# Patient Record
Sex: Female | Born: 1945 | Race: White | Hispanic: No | Marital: Married | Smoking: Never smoker
Health system: Southern US, Community
[De-identification: ages and names within clinical notes are randomized; demographics above are authoritative.]

## PROBLEM LIST (undated history)

## (undated) DIAGNOSIS — I10 Essential (primary) hypertension: Secondary | ICD-10-CM

## (undated) DIAGNOSIS — C801 Malignant (primary) neoplasm, unspecified: Secondary | ICD-10-CM

## (undated) DIAGNOSIS — M1990 Unspecified osteoarthritis, unspecified site: Secondary | ICD-10-CM

## (undated) DIAGNOSIS — J189 Pneumonia, unspecified organism: Secondary | ICD-10-CM

## (undated) DIAGNOSIS — E119 Type 2 diabetes mellitus without complications: Secondary | ICD-10-CM

## (undated) DIAGNOSIS — F419 Anxiety disorder, unspecified: Secondary | ICD-10-CM

## (undated) DIAGNOSIS — E039 Hypothyroidism, unspecified: Secondary | ICD-10-CM

## (undated) DIAGNOSIS — J45909 Unspecified asthma, uncomplicated: Secondary | ICD-10-CM

## (undated) HISTORY — DX: Essential (primary) hypertension: I10

## (undated) HISTORY — PX: OTHER SURGICAL HISTORY: SHX169

## (undated) HISTORY — PX: KNEE ARTHROSCOPY: SUR90

## (undated) HISTORY — PX: BREAST BIOPSY: SHX20

## (undated) HISTORY — PX: TONSILLECTOMY: SUR1361

## (undated) HISTORY — PX: SHOULDER SURGERY: SHX246

---

## 1983-02-26 HISTORY — PX: KNEE ARTHROSCOPY: SUR90

## 1996-02-26 HISTORY — PX: SHOULDER SURGERY: SHX246

## 2007-02-24 ENCOUNTER — Ambulatory Visit (HOSPITAL_COMMUNITY): Admission: RE | Admit: 2007-02-24 | Discharge: 2007-02-24 | Payer: Self-pay | Admitting: Orthopedic Surgery

## 2011-07-11 ENCOUNTER — Encounter (HOSPITAL_COMMUNITY): Payer: Self-pay | Admitting: Pharmacist

## 2011-07-15 ENCOUNTER — Encounter (HOSPITAL_COMMUNITY)
Admission: RE | Disposition: A | Discharge status: Home / Self Care | Payer: Self-pay | Source: Ambulatory Visit | Attending: Ophthalmology

## 2011-07-15 ENCOUNTER — Ambulatory Visit (HOSPITAL_COMMUNITY)
Admission: RE | Admit: 2011-07-15 | Discharge: 2011-07-15 | Disposition: A | Discharge status: Home / Self Care | Payer: Medicare Other | Source: Ambulatory Visit | Attending: Ophthalmology | Admitting: Ophthalmology

## 2011-07-15 DIAGNOSIS — H26499 Other secondary cataract, unspecified eye: Secondary | ICD-10-CM | POA: Insufficient documentation

## 2011-07-15 SURGERY — TREATMENT, USING YAG LASER
Anesthesia: LOCAL | Site: Eye | Laterality: Right

## 2011-07-15 MED ORDER — TETRACAINE HCL 0.5 % OP SOLN
OPHTHALMIC | Status: AC
  Administered 2011-07-15: 1 [drp] via OPHTHALMIC
  Filled 2011-07-15: qty 2

## 2011-07-15 MED ORDER — APRACLONIDINE HCL 1 % OP SOLN
1.0000 [drp] | Freq: Once | OPHTHALMIC | Status: AC
  Administered 2011-07-15: 1 [drp] via OPHTHALMIC

## 2011-07-15 MED ORDER — TROPICAMIDE 1 % OP SOLN
OPHTHALMIC | Status: AC
  Administered 2011-07-15: 1 [drp] via OPHTHALMIC
  Filled 2011-07-15: qty 3

## 2011-07-15 MED ORDER — TETRACAINE HCL 0.5 % OP SOLN
1.0000 [drp] | Freq: Two times a day (BID) | OPHTHALMIC | Status: AC
  Administered 2011-07-15 (×2): 1 [drp] via OPHTHALMIC

## 2011-07-15 MED ORDER — APRACLONIDINE HCL 1 % OP SOLN
OPHTHALMIC | Status: AC
  Administered 2011-07-15: 1 [drp] via OPHTHALMIC
  Filled 2011-07-15: qty 0.1

## 2011-07-15 MED ORDER — TROPICAMIDE 1 % OP SOLN
1.0000 [drp] | Freq: Three times a day (TID) | OPHTHALMIC | Status: DC
  Administered 2011-07-15 (×2): 1 [drp] via OPHTHALMIC

## 2011-07-15 MED ORDER — PILOCARPINE HCL 1 % OP SOLN
OPHTHALMIC | Status: AC
  Filled 2011-07-15: qty 15

## 2011-07-15 MED ORDER — APRACLONIDINE HCL 1 % OP SOLN
1.0000 [drp] | Freq: Three times a day (TID) | OPHTHALMIC | Status: AC
  Administered 2011-07-15 (×3): 1 [drp] via OPHTHALMIC

## 2011-07-15 NOTE — CV Procedure (Signed)
Whitney Gill 07/15/2011  Susa Simmonds, MD  Yag Laser Self Test Completedyes. Procedure: Posterior Capsulotomy, right eye.  Eye Protection Worn by Staff yes. Laser In Use Sign on Door yes.  Laser: Nd:YAG Spot Size: Fixed Burst Mode: III Power Setting: 2.0  mJ/burst Position treated: central capsule o'clock position Number of shots: 30 Total energy delivered: 58.9 mJ  Patency of the peripheral iridotomy was confirmed visually.  The patient tolerated the procedure without difficulty. No complications were encountered.  Tenometer reading immediately after procedure: 17/19 mmHg.  The patient was discharged home with the instructions to continue all her current glaucoma medications, if any.   Patient instructed to go to office prn for intraocular pressure check if eye pain occurs.  Patient verbalizes understanding of discharge instructions yes.   Notes:wrinkled, thickened posterior capsule noted.

## 2011-07-15 NOTE — H&P (Signed)
See scanned H&P from office 

## 2011-07-15 NOTE — Discharge Instructions (Signed)
Stevana Dufner  07/12/2011     Instructions    Activity: No Restrictions.   Diet: Resume Diet you were on at home.   Pain Medication: Tylenol if Needed.   CONTACT YOUR DOCTOR IF YOU HAVE PAIN, REDNESS IN YOUR EYE, OR DECREASED VISION.   Follow-up:{follow up:32580} with Susa Simmonds, MD.   Dr. Lahoma Crocker: [phone]  Dr. Lita Mains: [phone]  Dr. Alto Denver: [phone]   If you find that you cannot contact your physician, but feel that your signs and   Symptoms warrant a physician's attention, call the Emergency Room at   [phone] [phone].   Other{NA AND PPIRJJOA:41660}.

## 2014-08-11 ENCOUNTER — Encounter: Payer: Self-pay | Admitting: Internal Medicine

## 2014-08-11 ENCOUNTER — Encounter (INDEPENDENT_AMBULATORY_CARE_PROVIDER_SITE_OTHER): Payer: Self-pay

## 2014-08-11 ENCOUNTER — Ambulatory Visit (INDEPENDENT_AMBULATORY_CARE_PROVIDER_SITE_OTHER): Payer: Medicare Other | Admitting: Internal Medicine

## 2014-08-11 VITALS — BP 160/108 | HR 78 | Ht 67.0 in | Wt 219.0 lb

## 2014-08-11 DIAGNOSIS — K219 Gastro-esophageal reflux disease without esophagitis: Secondary | ICD-10-CM | POA: Diagnosis not present

## 2014-08-11 DIAGNOSIS — I10 Essential (primary) hypertension: Secondary | ICD-10-CM

## 2014-08-11 DIAGNOSIS — R05 Cough: Secondary | ICD-10-CM | POA: Diagnosis not present

## 2014-08-11 DIAGNOSIS — R058 Other specified cough: Secondary | ICD-10-CM | POA: Insufficient documentation

## 2014-08-11 MED ORDER — PANTOPRAZOLE SODIUM 40 MG PO TBEC: 40.0000 mg | DELAYED_RELEASE_TABLET | Freq: Every day | ORAL | Status: DC

## 2014-08-11 MED ORDER — TRAMADOL HCL 50 MG PO TABS: ORAL_TABLET | ORAL | Status: DC

## 2014-08-11 NOTE — Progress Notes (Signed)
Subjective:    Patient ID: Whitney Gill, female    DOB: Sep 29, 1945,     MRN: [id number]  HPI  67 yowf never smoked "bronchitis" in elementary school resolved/  ok through hs s meds including sports / then asthma in [employer] admitted for a week early 8s p ? 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 Then around 2010 needed zyrtec just in spring prn pnds and occ   had bronchitis but no need for maint rx then recurrent cough starting May 2016 and has failed qvar / rocephin/ zpak and no better so referred by Dr Dimas Aguas to pulmonary clinic 08/11/2014    08/11/2014 [facility] office visit/ Corwin Kuiken   Chief Complaint  Patient presents with  . Pulmonary Consult    Referred by Dr. Selinda Flavin. Pt c/o cough x 5 wks- occ prod with minimal clear to yellow sputum. She also c/o occ chest tightness. Cough gets worse when she talks.   retired from teaching 2004 and tendency to rhinitis contolled with zyrtec now with persistent daily cough x 5 weeks  Worse with talking  And generally less at hs but needing to sleep in recliner due to noct cough x sev weeks Stopped maint ppi w/in the last year due to reports re renal effects  No obvious other patterns in day to day or daytime variabilty or assoc sob unless coughing or  cp or subjective wheeze overt sinus or hb symptoms. No unusual exp hx or h/o childhood pna/ asthma (just "bronchitis" ) or knowledge of premature birth.   Also denies any obvious fluctuation of symptoms with weather or environmental changes or other aggravating or alleviating factors except as outlined above   Current Medications, Allergies, Complete Past Medical History, Past Surgical History, Family History, and Social History were reviewed in [employer] record.              Review of Systems  Constitutional: Negative for fever, chills and unexpected weight change.  HENT: Negative for congestion, dental problem, ear pain, nosebleeds, postnasal drip, rhinorrhea,  sinus pressure, sneezing, sore throat, trouble swallowing and voice change.   Eyes: Negative for visual disturbance.  Respiratory: Positive for cough. Negative for choking and shortness of breath.   Cardiovascular: Negative for chest pain and leg swelling.  Gastrointestinal: Negative for vomiting, abdominal pain and diarrhea.  Genitourinary: Negative for difficulty urinating.  Musculoskeletal: Negative for arthralgias.  Skin: Negative for rash.  Neurological: Negative for tremors, syncope and headaches.  Hematological: Does not bruise/bleed easily.       Objective:   Physical Exam  amb wf nad very hoarse   Wt Readings from Last 3 Encounters:  08/11/14 219 lb (99.338 kg)    Vital signs reviewed/ note HBP  HEENT: nl dentition, turbinates, and orophanx. Nl external ear canals without cough reflex   NECK :  without JVD/Nodes/TM/ nl carotid upstrokes bilaterally   LUNGS: no acc muscle use, clear to A and P bilaterally with cough on insp maneuvers only   CV:  RRR  no s3 or murmur or increase in P2, no edema   ABD:  soft and nontender with nl excursion in the supine position. No bruits or organomegaly, bowel sounds nl  MS:  warm without deformities, calf tenderness, cyanosis or clubbing  SKIN: warm and dry without lesions    NEURO:  alert, approp, no deficits    I personally reviewed images and agree with radiology impression as follows:  CXR:  08/03/14 wnl  Assessment & Plan:

## 2014-08-11 NOTE — Patient Instructions (Addendum)
Upper airway cough syndrome/ Whitney Gill   The key to effective treatment for your cough is eliminating the non-stop cycle of cough you're stuck in long enough to let your airway heal completely and then see if there is anything still making you cough once you stop the cough suppression, but this should take no more than 5 days to figure out  First take delsym two tsp every 12 hours and supplement if needed with  tramadol 50 mg up to 2 every 4 hours to suppress the urge to cough at all or even clear your throat. Swallowing water or using ice chips/non mint and menthol containing candies (such as lifesavers or sugarless jolly ranchers) are also effective.  You should rest your voice and avoid activities that you know make you cough.  Once you have eliminated the cough for 3 straight days try reducing the tramadol first,  then the delsym as tolerated.     Protonix (pantoprazole) Take 30-60 min before first meal of the day and Pepcid 20 mg one bedtime plus chlorpheniramine 4 mg x 2 at bedtime (both available over the counter)  until cough is completely gone for at least a week without the need for cough suppression  GERD (REFLUX)  is an extremely common cause of respiratory symptoms, many times with no significant heartburn at all.    It can be treated with medication, but also with lifestyle changes including avoidance of late meals, excessive alcohol, smoking cessation, and avoid fatty foods, chocolate, peppermint, colas, red wine, and acidic juices such as orange juice.  NO MINT OR MENTHOL PRODUCTS SO NO COUGH DROPS  USE HARD CANDY INSTEAD (jolley ranchers or Stover's or Lifesavers (all available in sugarless versions) NO OIL BASED VITAMINS - use powdered substitutes.  Stop qvar   If not better in two weeks >  Next step See Tammy NP  with all your medications, even over the counter meds, separated in two separate bags, the ones you take no matter what vs the ones you stop once you feel better  and take only as needed when you feel you need them.   Tammy  will generate for you a new user friendly medication calendar that will put [place] all on the same page re: your medication use.     Without this process, it simply isn't possible to assure that we are providing  your outpatient care  with  the attention to detail we feel you deserve.   If we cannot assure that you're getting that kind of care,  then we cannot manage your problem effectively from this clinic.  Once you have seen Tammy and we are sure that we're all on the same page with your medication use she will arrange follow up with me.

## 2014-08-12 ENCOUNTER — Encounter: Payer: Self-pay | Admitting: Internal Medicine

## 2014-08-12 DIAGNOSIS — I10 Essential (primary) hypertension: Secondary | ICD-10-CM | POA: Insufficient documentation

## 2014-08-12 DIAGNOSIS — K219 Gastro-esophageal reflux disease without esophagitis: Secondary | ICD-10-CM | POA: Insufficient documentation

## 2014-08-12 NOTE — Assessment & Plan Note (Signed)
Not Adequate control on present rx, reviewed > rec  No salt.  Would be careful with higher doses of BB here if concern with asthma in the ddx ( thoug I think this is less likely than uacs at present)   I prefer in this setting: Bystolic, the most beta -1  selective Beta blocker available in sample form, with bisoprolol the most selective generic choice  on the market but defer rx/ f/u to Dr Jeannette How capable hands.

## 2014-08-12 NOTE — Assessment & Plan Note (Addendum)
Body mass index is 34.29 kg/(m^2).  Note main risk factor is obesity and  she was on ppi as maint before stopping it and having the worst cough of her life. See uacs  Discussed the recent press about ppi's in the context of a statistically significant (but questionably clinically relevant) increase in CRI in pts on ppi vs h2's > bottom line is the lowest dose of ppi that controls   gerd is the right dose and if that dose is zero that's fine esp since h2's are cheaper but in the short run for this pattern cough she needs at least to take 40 mg  ppi ac daily.

## 2014-08-12 NOTE — Assessment & Plan Note (Addendum)
-   spirometry 08/05/14 wnl including mid flows   The most common causes of chronic cough in immunocompetent adults include the following: upper airway cough syndrome (UACS), previously referred to as postnasal drip syndrome (PNDS), which is caused by variety of rhinosinus conditions; (2) asthma; (3) GERD; (4) chronic bronchitis from cigarette smoking or other inhaled environmental irritants; (5) nonasthmatic eosinophilic bronchitis; and (6) bronchiectasis.   These conditions, singly or in combination, have accounted for up to 94% of the causes of chronic cough in prospective studies.   Other conditions have constituted no >6% of the causes in prospective studies These have included bronchogenic carcinoma, chronic interstitial pneumonia, sarcoidosis, left ventricular failure, ACEI-induced cough, and aspiration from a condition associated with pharyngeal dysfunction.    Chronic cough is often simultaneously caused by more than one condition. A single cause has been found from 38 to 82% of the time, multiple causes from 18 to 62%. Multiply caused cough has been the result of three diseases up to 42% of the time.       Based on hx and exam, this is most likely:  Classic Upper airway cough syndrome, so named because it's frequently impossible to sort out how much is  CR/sinusitis with freq throat clearing (which can be related to primary GERD)   vs  causing  secondary (" extra esophageal")  GERD from wide swings in gastric pressure that occur with throat clearing, often  promoting self use of mint and menthol lozenges that reduce the lower esophageal sphincter tone and exacerbate the problem further in a cyclical fashion.   These are the same pts (now being labeled as having "irritable larynx syndrome" by some cough centers) who not infrequently have a history of having failed to tolerate ace inhibitors,  dry powder inhalers(or ICS in any form, even qvar)  or biphosphonates or report having atypical reflux  symptoms that don't respond to standard doses of PPI , and are easily confused as having aecopd or asthma flares by even experienced allergists/ pulmonologists.   The first step is to maximize acid suppression and eliminate cyclical coughing then regroup if the cough persists off qvar  Explained the natural history of uri and why it's necessary in patients at risk to treat GERD aggressively - at least  short term -   to reduce risk of evolving cyclical cough initially  triggered by epithelial injury and a heightened sensitivty to the effects of any upper airway irritants,  most importantly acid - related - then perpetuated by epithelial injury related to the cough itself as the upper airway collapses on itself.  That is, the more sensitive the epithelium becomes once it is damaged by the virus, the more the ensuing irritability> the more the cough, the more the secondary reflux (especially in those prone to reflux) the more the irritation of the sensitive mucosa and so on in a  Classic cyclical pattern.     I had an extended discussion with the patient reviewing all relevant studies completed to date and  lasting 35 m Each maintenance medication was reviewed in detail including most importantly the difference between maintenance and as needed and under what circumstances the prns are to be used.  Please see instructions for details which were reviewed in writing and the patient given a copy.

## 2014-09-06 ENCOUNTER — Encounter: Payer: Medicare Other | Admitting: Adult Health

## 2016-04-25 ENCOUNTER — Encounter (INDEPENDENT_AMBULATORY_CARE_PROVIDER_SITE_OTHER): Payer: Self-pay | Admitting: *Deleted

## 2018-05-13 ENCOUNTER — Encounter (INDEPENDENT_AMBULATORY_CARE_PROVIDER_SITE_OTHER): Payer: Self-pay | Admitting: *Deleted

## 2018-06-09 ENCOUNTER — Telehealth: Payer: Self-pay | Admitting: Internal Medicine

## 2018-06-09 NOTE — Telephone Encounter (Signed)
Pt has not been seen at office since 08/11/2014 but meds last prescribed by MW at that visit are listed below: First take delsym two tsp every 12 hours and supplement if needed with  tramadol 50 mg up to 2 every 4 hours to suppress the urge to cough at all or even clear your throat. Swallowing water or using ice chips/non mint and menthol containing candies (such as lifesavers or sugarless jolly ranchers) are also effective.  You should rest your voice and avoid activities that you know make you cough.  Once you have eliminated the cough for 3 straight days try reducing the tramadol first,  then the delsym as tolerated.    Protonix (pantoprazole) Take 30-60 min before first meal of the day and Pepcid 20 mg one bedtime plus chlorpheniramine 4 mg x 2 at bedtime (both available over the counter)  until cough is completely gone for at least a week without the need for cough suppression.  Attempted to call pt at number listed x3 but each time I called, immediately received a recording stating that the service has not been set up at the number that we were calling. Unable to leave a message. Will try to call back later.

## 2018-06-10 NOTE — Telephone Encounter (Signed)
Returned call to patient. Relayed message per AVS 07/2014. Explained to patient how to reset her mychart. She is to then call office back. We will send her AVS via my chart. Pt has cough again and wanted to know what Dr.Wert prescribed. Nothing further needed.

## 2018-06-29 ENCOUNTER — Ambulatory Visit (INDEPENDENT_AMBULATORY_CARE_PROVIDER_SITE_OTHER): Payer: Medicare Other | Admitting: Internal Medicine

## 2018-06-29 ENCOUNTER — Encounter (INDEPENDENT_AMBULATORY_CARE_PROVIDER_SITE_OTHER): Payer: Self-pay | Admitting: Internal Medicine

## 2018-06-29 ENCOUNTER — Other Ambulatory Visit: Payer: Self-pay

## 2018-06-29 VITALS — BP 131/78 | HR 109 | Temp 98.1°F | Ht 67.0 in | Wt 201.2 lb

## 2018-06-29 DIAGNOSIS — K580 Irritable bowel syndrome with diarrhea: Secondary | ICD-10-CM

## 2018-06-29 NOTE — Progress Notes (Signed)
Subjective:     Patient ID: Whitney Gill, female   DOB: 10/02/1945, 73 y.o.   MRN: [id number]  HPI Referred by Dr. Dimas Aguas for diarrhea/colonoscopy.She tells me she is doing okay. Sometimes she has urgency after meals at times. Last episode was about 2 weeks ago while she was at home. She says she cannot put a pattern to it. For the most part, she has 2-3 soft stools a a day. She does tell me that we she has urgency, there is no warning. Symptoms for several years. No rectal bleeding. Her appetite is good. No weight loss.    Colonoscopy 2008: Mild diverticulitis.(per records)  No family hx of colon cancer. Review of Systems Past Medical History:  Diagnosis Date  . Hypertension     History reviewed. No pertinent surgical history.  Allergies  Allergen Reactions  . Prednisone Palpitations    Heart racing & turns red    Current Outpatient Medications on File Prior to Visit  Medication Sig Dispense Refill  . albuterol (VENTOLIN HFA) 108 (90 Base) MCG/ACT inhaler Inhale into the lungs every 6 (six) hours as needed for wheezing or shortness of breath.    Marland Kitchen aspirin EC 81 MG tablet Take 81 mg by mouth daily.    Marland Kitchen atenolol (TENORMIN) 50 MG tablet Take 50 mg by mouth daily.    Marland Kitchen atorvastatin (LIPITOR) 20 MG tablet Take 20 mg by mouth daily.    . Calcium Carbonate-Vit D-Min (CALTRATE 600+D PLUS PO) Take by mouth.    . cetirizine (ZYRTEC) 10 MG chewable tablet Chew 10 mg by mouth daily.    . Cholecalciferol (VITAMIN D3) 10 MCG (400 UNIT) CAPS Take 1,000 Units by mouth.    . Coenzyme Q10 (COQ10) 200 MG CAPS Take by mouth.    . famotidine (PEPCID) 20 MG tablet Take 20 mg by mouth 2 (two) times daily.    . fluticasone (FLONASE) 50 MCG/ACT nasal spray Place 2 sprays into both nostrils daily.    . hydrochlorothiazide (MICROZIDE) 12.5 MG capsule Take 12.5 mg by mouth daily.    Marland Kitchen levothyroxine (SYNTHROID, LEVOTHROID) 75 MCG tablet Take 75 mcg by mouth daily.    Marland Kitchen lisinopril (ZESTRIL) 5 MG  tablet Take 5 mg by mouth daily.    . metFORMIN (GLUCOPHAGE) 500 MG tablet Take by mouth daily.    . Multiple Vitamin (MULITIVITAMIN WITH MINERALS) TABS Take 1 tablet by mouth daily.    . Probiotic Product (ALIGN) 4 MG CAPS Take by mouth.    . TURMERIC PO Take by mouth.    . zolpidem (AMBIEN) 10 MG tablet Take 10 mg by mouth at bedtime as needed for sleep.     No current facility-administered medications on file prior to visit.         Objective:   Physical Exam Blood pressure 131/78, pulse (!) 109, temperature 98.1 F (36.7 C), height 5\' 7"  (1.702 m), weight 201 lb 3.2 oz (91.3 kg). Alert and oriented. Skin warm and dry. Oral mucosa is moist.   . Sclera anicteric, conjunctivae is pink. Thyroid not enlarged. No cervical lymphadenopathy. Lungs clear. Heart regular rate and rhythm.  Abdomen is soft. Bowel sounds are positive. No hepatomegaly. No abdominal masses felt. No tenderness.  No edema to lower extremities.        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 Continue the Probiotic Screening colonoscopy. The risks of bleeding, perforation and infection were reviewed with patient.     Plan:    Screening colonoscopy.  Continue the Probiotic. The risks of bleeding, perforation and infection were reviewed with patient.

## 2018-06-29 NOTE — Patient Instructions (Signed)
The risks of bleeding, perforation and infection were reviewed with patient. Continue the probiotic.

## 2018-06-30 ENCOUNTER — Encounter (INDEPENDENT_AMBULATORY_CARE_PROVIDER_SITE_OTHER): Payer: Self-pay | Admitting: Internal Medicine

## 2018-08-05 ENCOUNTER — Other Ambulatory Visit (INDEPENDENT_AMBULATORY_CARE_PROVIDER_SITE_OTHER): Payer: Self-pay | Admitting: Internal Medicine

## 2018-08-05 ENCOUNTER — Telehealth (INDEPENDENT_AMBULATORY_CARE_PROVIDER_SITE_OTHER): Payer: Self-pay | Admitting: *Deleted

## 2018-08-05 ENCOUNTER — Telehealth (INDEPENDENT_AMBULATORY_CARE_PROVIDER_SITE_OTHER): Payer: Self-pay | Admitting: Internal Medicine

## 2018-08-05 DIAGNOSIS — R05 Cough: Secondary | ICD-10-CM | POA: Insufficient documentation

## 2018-08-05 DIAGNOSIS — K580 Irritable bowel syndrome with diarrhea: Secondary | ICD-10-CM

## 2018-08-05 DIAGNOSIS — Z1211 Encounter for screening for malignant neoplasm of colon: Secondary | ICD-10-CM

## 2018-08-05 DIAGNOSIS — R059 Cough, unspecified: Secondary | ICD-10-CM | POA: Insufficient documentation

## 2018-08-05 NOTE — Telephone Encounter (Signed)
Whitney Gill, EGD. Chronic cough

## 2018-08-05 NOTE — Telephone Encounter (Signed)
Please call patient -- she wants to discuss maybe adding EGD to TCS  Ph# [phone]

## 2018-08-05 NOTE — Telephone Encounter (Signed)
I have spoken with patient. Dr. Nadara Mustard want her to have an EGD for her chronic cough.

## 2018-08-06 NOTE — Telephone Encounter (Signed)
TCS/EGD sch'd 11/25/18, patient aware

## 2018-08-06 NOTE — Telephone Encounter (Signed)
TCS/EGD sch'd 11/25/18, patient aware 

## 2018-10-06 ENCOUNTER — Encounter (INDEPENDENT_AMBULATORY_CARE_PROVIDER_SITE_OTHER): Payer: Self-pay | Admitting: *Deleted

## 2018-10-06 ENCOUNTER — Telehealth (INDEPENDENT_AMBULATORY_CARE_PROVIDER_SITE_OTHER): Payer: Self-pay | Admitting: *Deleted

## 2018-10-06 DIAGNOSIS — Z1211 Encounter for screening for malignant neoplasm of colon: Secondary | ICD-10-CM

## 2018-10-06 MED ORDER — PEG 3350-KCL-NA BICARB-NACL 420 G PO SOLR: 4000.0000 mL | Freq: Once | ORAL | 0 refills | Status: AC

## 2018-10-06 NOTE — Telephone Encounter (Signed)
Patient needs trilyte TCS sch'd in Sept 

## 2018-10-19 ENCOUNTER — Ambulatory Visit (INDEPENDENT_AMBULATORY_CARE_PROVIDER_SITE_OTHER): Payer: Medicare Other | Admitting: Otolaryngology

## 2018-10-19 DIAGNOSIS — H6123 Impacted cerumen, bilateral: Secondary | ICD-10-CM

## 2018-10-19 DIAGNOSIS — H903 Sensorineural hearing loss, bilateral: Secondary | ICD-10-CM

## 2018-11-16 ENCOUNTER — Telehealth (INDEPENDENT_AMBULATORY_CARE_PROVIDER_SITE_OTHER): Payer: Self-pay | Admitting: *Deleted

## 2018-11-16 DIAGNOSIS — Z1211 Encounter for screening for malignant neoplasm of colon: Secondary | ICD-10-CM

## 2018-11-16 DIAGNOSIS — K580 Irritable bowel syndrome with diarrhea: Secondary | ICD-10-CM

## 2018-11-16 MED ORDER — PEG 3350-KCL-NA BICARB-NACL 420 G PO SOLR: 4000.0000 mL | Freq: Once | ORAL | 0 refills | Status: AC

## 2018-11-16 NOTE — Telephone Encounter (Signed)
Patient need trilyte TCS sch'd 9/30

## 2018-11-23 ENCOUNTER — Other Ambulatory Visit (HOSPITAL_COMMUNITY)
Admission: RE | Admit: 2018-11-23 | Discharge: 2018-11-23 | Disposition: A | Discharge status: Home / Self Care | Payer: Medicare Other | Source: Ambulatory Visit | Attending: Internal Medicine | Admitting: Internal Medicine

## 2018-11-23 DIAGNOSIS — Z01812 Encounter for preprocedural laboratory examination: Secondary | ICD-10-CM | POA: Insufficient documentation

## 2018-11-23 DIAGNOSIS — Z20828 Contact with and (suspected) exposure to other viral communicable diseases: Secondary | ICD-10-CM | POA: Diagnosis not present

## 2018-11-23 LAB — SARS CORONAVIRUS 2 (TAT 6-24 HRS): SARS Coronavirus 2: NEGATIVE

## 2018-11-25 ENCOUNTER — Encounter (HOSPITAL_COMMUNITY)
Admission: RE | Disposition: A | Discharge status: Home / Self Care | Payer: Self-pay | Source: Home / Self Care | Attending: Internal Medicine

## 2018-11-25 ENCOUNTER — Ambulatory Visit (HOSPITAL_COMMUNITY)
Admission: RE | Admit: 2018-11-25 | Discharge: 2018-11-25 | Disposition: A | Discharge status: Home / Self Care | Payer: Medicare Other | Attending: Internal Medicine | Admitting: Internal Medicine

## 2018-11-25 ENCOUNTER — Encounter (HOSPITAL_COMMUNITY): Payer: Self-pay | Admitting: *Deleted

## 2018-11-25 ENCOUNTER — Other Ambulatory Visit: Payer: Self-pay

## 2018-11-25 DIAGNOSIS — Z803 Family history of malignant neoplasm of breast: Secondary | ICD-10-CM | POA: Insufficient documentation

## 2018-11-25 DIAGNOSIS — R059 Cough, unspecified: Secondary | ICD-10-CM | POA: Insufficient documentation

## 2018-11-25 DIAGNOSIS — I10 Essential (primary) hypertension: Secondary | ICD-10-CM | POA: Insufficient documentation

## 2018-11-25 DIAGNOSIS — Z1211 Encounter for screening for malignant neoplasm of colon: Secondary | ICD-10-CM

## 2018-11-25 DIAGNOSIS — R05 Cough: Secondary | ICD-10-CM | POA: Insufficient documentation

## 2018-11-25 DIAGNOSIS — Z825 Family history of asthma and other chronic lower respiratory diseases: Secondary | ICD-10-CM | POA: Diagnosis not present

## 2018-11-25 DIAGNOSIS — Z888 Allergy status to other drugs, medicaments and biological substances status: Secondary | ICD-10-CM | POA: Insufficient documentation

## 2018-11-25 DIAGNOSIS — Z7982 Long term (current) use of aspirin: Secondary | ICD-10-CM | POA: Insufficient documentation

## 2018-11-25 DIAGNOSIS — E039 Hypothyroidism, unspecified: Secondary | ICD-10-CM | POA: Diagnosis not present

## 2018-11-25 DIAGNOSIS — Z8249 Family history of ischemic heart disease and other diseases of the circulatory system: Secondary | ICD-10-CM | POA: Diagnosis not present

## 2018-11-25 DIAGNOSIS — K644 Residual hemorrhoidal skin tags: Secondary | ICD-10-CM | POA: Diagnosis not present

## 2018-11-25 DIAGNOSIS — Z7951 Long term (current) use of inhaled steroids: Secondary | ICD-10-CM | POA: Insufficient documentation

## 2018-11-25 DIAGNOSIS — Z833 Family history of diabetes mellitus: Secondary | ICD-10-CM | POA: Diagnosis not present

## 2018-11-25 DIAGNOSIS — Z79899 Other long term (current) drug therapy: Secondary | ICD-10-CM | POA: Diagnosis not present

## 2018-11-25 DIAGNOSIS — Z7984 Long term (current) use of oral hypoglycemic drugs: Secondary | ICD-10-CM | POA: Diagnosis not present

## 2018-11-25 DIAGNOSIS — E119 Type 2 diabetes mellitus without complications: Secondary | ICD-10-CM | POA: Insufficient documentation

## 2018-11-25 DIAGNOSIS — K6289 Other specified diseases of anus and rectum: Secondary | ICD-10-CM | POA: Diagnosis not present

## 2018-11-25 DIAGNOSIS — K5730 Diverticulosis of large intestine without perforation or abscess without bleeding: Secondary | ICD-10-CM | POA: Insufficient documentation

## 2018-11-25 DIAGNOSIS — K228 Other specified diseases of esophagus: Secondary | ICD-10-CM | POA: Diagnosis not present

## 2018-11-25 HISTORY — PX: COLONOSCOPY: SHX5424

## 2018-11-25 HISTORY — PX: ESOPHAGOGASTRODUODENOSCOPY: SHX5428

## 2018-11-25 HISTORY — DX: Type 2 diabetes mellitus without complications: E11.9

## 2018-11-25 HISTORY — DX: Hypothyroidism, unspecified: E03.9

## 2018-11-25 LAB — GLUCOSE, CAPILLARY: Glucose-Capillary: 161 mg/dL — ABNORMAL HIGH (ref 70–99)

## 2018-11-25 SURGERY — COLONOSCOPY
Anesthesia: Moderate Sedation

## 2018-11-25 MED ORDER — LIDOCAINE HCL URETHRAL/MUCOSAL 2 % EX GEL
CUTANEOUS | Status: AC
  Filled 2018-11-25: qty 30

## 2018-11-25 MED ORDER — MEPERIDINE HCL 50 MG/ML IJ SOLN
INTRAMUSCULAR | Status: DC | PRN
  Administered 2018-11-25 (×2): 25 mg via INTRAVENOUS

## 2018-11-25 MED ORDER — STERILE WATER FOR IRRIGATION IR SOLN
Status: DC | PRN
  Administered 2018-11-25: 11:00:00 4 mL

## 2018-11-25 MED ORDER — LIDOCAINE HCL URETHRAL/MUCOSAL 2 % EX GEL
CUTANEOUS | Status: DC | PRN
  Administered 2018-11-25: 1 via TOPICAL

## 2018-11-25 MED ORDER — MIDAZOLAM HCL 5 MG/5ML IJ SOLN
INTRAMUSCULAR | Status: DC | PRN
  Administered 2018-11-25: 1 mg via INTRAVENOUS
  Administered 2018-11-25: 2 mg via INTRAVENOUS
  Administered 2018-11-25: 1 mg via INTRAVENOUS
  Administered 2018-11-25: 2 mg via INTRAVENOUS
  Administered 2018-11-25: 1 mg via INTRAVENOUS

## 2018-11-25 MED ORDER — PANTOPRAZOLE SODIUM 40 MG PO TBEC: 40.0000 mg | DELAYED_RELEASE_TABLET | Freq: Two times a day (BID) | ORAL | 1 refills | Status: DC

## 2018-11-25 MED ORDER — MIDAZOLAM HCL 5 MG/5ML IJ SOLN
INTRAMUSCULAR | Status: AC
  Filled 2018-11-25: qty 10

## 2018-11-25 MED ORDER — SODIUM CHLORIDE 0.9 % IV SOLN
INTRAVENOUS | Status: DC
  Administered 2018-11-25: 1000 mL via INTRAVENOUS

## 2018-11-25 MED ORDER — LIDOCAINE VISCOUS HCL 2 % MT SOLN
OROMUCOSAL | Status: DC | PRN
  Administered 2018-11-25: 6 mL via OROMUCOSAL

## 2018-11-25 MED ORDER — LIDOCAINE VISCOUS HCL 2 % MT SOLN
OROMUCOSAL | Status: AC
  Filled 2018-11-25: qty 15

## 2018-11-25 MED ORDER — MEPERIDINE HCL 50 MG/ML IJ SOLN
INTRAMUSCULAR | Status: AC
  Filled 2018-11-25: qty 1

## 2018-11-25 NOTE — Op Note (Signed)
[facility] Patient Name: Whitney Gill Procedure Date: 11/25/2018 10:44 AM MRN: [id number] Date of Birth: 1946-02-23 Attending MD: Lionel DecemberNajeeb Vilda Zollner , MD CSN: [id number] Age: 73 Admit Type: Outpatient Procedure:                Upper GI endoscopy Indications:              Chronic cough Providers:                Lionel DecemberNajeeb Lamaya Hyneman, MD, Judee ClaraPamela Shreve, RN, Edythe ClarityKelly Cox,                            Technician Referring MD:             Selinda FlavinKevin Howard, MD Medicines:                Lidocaine spray, Meperidine 50 mg IV, Midazolam 4                            mg IV Complications:            No immediate complications. Estimated Blood Loss:     Estimated blood loss: none. Procedure:                Pre-Anesthesia Assessment:                           - Prior to the procedure, a History and Physical                            was performed, and patient medications and                            allergies were reviewed. The patient's tolerance of                            previous anesthesia was also reviewed. The risks                            and benefits of the procedure and the sedation                            options and risks were discussed with the patient.                            All questions were answered, and informed consent                            was obtained. Prior Anticoagulants: The patient has                            taken no previous anticoagulant or antiplatelet                            agents except for aspirin. ASA Grade Assessment: II                            -  A patient with mild systemic disease. After                            reviewing the risks and benefits, the patient was                            deemed in satisfactory condition to undergo the                            procedure.                           After obtaining informed consent, the endoscope was                            passed under direct vision. Throughout the   procedure, the patient's blood pressure, pulse, and                            oxygen saturations were monitored continuously. The                            [id number]-[id number] ([id number]) was introduced through the                            mouth, and advanced to the second part of duodenum.                            The upper GI endoscopy was accomplished without                            difficulty. The patient tolerated the procedure                            well. Scope In: 11:04:34 AM Scope Out: 11:08:58 AM Total Procedure Duration: 0 hours 4 minutes 24 seconds  Findings:      The hypopharynx was normal.      The examined esophagus was normal.      The Z-line was irregular and was found 40 cm from the incisors.      The entire examined stomach was normal.      The duodenal bulb and second portion of the duodenum were normal. Impression:               - Normal hypopharynx.                           - Normal esophagus.                           - Z-line irregular, 40 cm from the incisors.                           - Normal stomach.                           - Normal duodenal bulb and second  portion of the                            duodenum.                           - No specimens collected. Moderate Sedation:      Moderate (conscious) sedation was administered by the endoscopy nurse       and supervised by the endoscopist. The following parameters were       monitored: oxygen saturation, heart rate, blood pressure, CO2       capnography and response to care. Total physician intraservice time was       9 minutes. Recommendation:           - Patient has a contact number available for                            emergencies. The signs and symptoms of potential                            delayed complications were discussed with the                            patient. Return to normal activities tomorrow.                            Written discharge instructions were provided to the                             patient.                           - Continue present medications but stop Pepci d and                            Omeprazole.                           - Pantoprazole 40 mg po bid for 8 weeks.                           - See the other procedure note for documentation of                            additional recommendations. Procedure Code(s):        --- Professional ---                           [phone], Esophagogastroduodenoscopy, flexible,                            transoral; diagnostic, including collection of                            specimen(s) by brushing or washing, when performed                            (  separate procedure) Diagnosis Code(s):        --- Professional ---                           K22.8, Other specified diseases of esophagus                           R05, Cough CPT copyright 2019 American Medical Association. All rights reserved. The codes documented in this report are preliminary and upon coder review may  be revised to meet current compliance requirements. Lionel December, MD Lionel December, MD 11/25/2018 11:41:45 AM This report has been signed electronically. Number of Addenda: 0

## 2018-11-25 NOTE — H&P (Addendum)
Whitney Gill is an 73 y.o. female.   Chief Complaint: Patient is here for EGD and colonoscopy. HPI: Patient 73 year old Caucasian female who has chronic cough unresponsive therapy.  She has not responded to famotidine and omeprazole..  Dr. Dimas Aguas is concerned that she may have chronic cough secondary to GERD and therefore advised diagnostic EGD she denies heartburn dysphagia nausea or vomiting.  She is also undergoing colonoscopy for screening purposes.  She denies abdominal pain melena or rectal bleeding but she does have intermittent or sporadic urgency and sometimes she has had accidents.  Last colonoscopy was in 2008. Family history is negative for CRC.  Past Medical History:  Diagnosis Date  . Diabetes mellitus without complication (HCC)   . Hypertension   . Hypothyroidism         Chronic bronchitis.       Chronic cough.  Past Surgical History:  Procedure Laterality Date  . BREAST BIOPSY    . KNEE ARTHROSCOPY     rt  . L ankle surgery    . SHOULDER SURGERY    . TONSILLECTOMY      Family History  Problem Relation Age of Onset  . COPD Mother        smoked  . Heart attack Father   . Diabetes Father   . Breast cancer Maternal Aunt    Social History:  reports that she has never smoked. She has never used smokeless tobacco. She reports that she does not drink alcohol or use drugs.  Allergies:  Allergies  Allergen Reactions  . Prednisone Palpitations    Heart racing & turns red    Medications Prior to Admission  Medication Sig Dispense Refill  . acetaminophen (TYLENOL) 650 MG CR tablet Take 650-1,300 mg by mouth every 8 (eight) hours as needed for pain.    Marland Kitchen aspirin EC 81 MG tablet Take 81 mg by mouth at bedtime.     Marland Kitchen atorvastatin (LIPITOR) 20 MG tablet Take 20 mg by mouth 2 (two) times a week. Mondays & Thursdays nights.    . Calcium Carb-Cholecalciferol (CALTRATE 600+D3 PO) Take 1 tablet by mouth daily.    . cholecalciferol (VITAMIN D) 25 MCG (1000 UT) tablet Take  1,000 Units by mouth 2 (two) times daily.    . Coenzyme Q10 (COQ10) 200 MG CAPS Take 200 mg by mouth at bedtime.     . famotidine (PEPCID) 20 MG tablet Take 20 mg by mouth at bedtime.     . fluticasone (FLONASE) 50 MCG/ACT nasal spray Place 2 sprays into both nostrils daily as needed (allergies.).     Marland Kitchen hydrochlorothiazide (MICROZIDE) 12.5 MG capsule Take 12.5 mg by mouth daily.    Marland Kitchen levothyroxine (SYNTHROID, LEVOTHROID) 75 MCG tablet Take 75 mcg by mouth daily before breakfast.     . metFORMIN (GLUCOPHAGE-XR) 500 MG 24 hr tablet Take 500 mg by mouth 2 (two) times daily.    . Multiple Vitamin (MULTIVITAMIN WITH MINERALS) TABS tablet Take 1 tablet by mouth daily. Centrum Silver    . omeprazole (PRILOSEC) 20 MG capsule Take 20 mg by mouth at bedtime.    Bertram Gala Glycol-Propyl Glycol (LUBRICANT EYE DROPS) 0.4-0.3 % SOLN Place 1 drop into both eyes 4 (four) times daily as needed (dry/irritated eyes.).    Marland Kitchen Probiotic Product (ALIGN) 4 MG CAPS Take 4 mg by mouth daily.     . TURMERIC PO Take 1 capsule by mouth at bedtime.     Marland Kitchen albuterol (VENTOLIN HFA) 108 (90  Base) MCG/ACT inhaler Inhale 1-2 puffs into the lungs every 6 (six) hours as needed for wheezing or shortness of breath.       Results for orders placed or performed during the hospital encounter of 11/25/18 (from the past 48 hour(s))  Glucose, capillary     Status: Abnormal   Collection Time: 11/25/18 10:15 AM  Result Value Ref Range   Glucose-Capillary 161 (H) 70 - 99 mg/dL   No results found.  ROS  Blood pressure (!) 141/62, pulse 80, temperature 98.3 F (36.8 C), temperature source Oral, resp. rate 15, height 5\' 7"  (1.702 m), weight 84.8 kg, SpO2 98 %. Physical Exam  Constitutional: She appears well-developed and well-nourished.  HENT:  Mouth/Throat: Oropharynx is clear and moist.  Eyes: Conjunctivae are normal. No scleral icterus.  Neck: No thyromegaly present.  Cardiovascular: Normal rate, regular rhythm and normal heart  sounds.  No murmur heard. Respiratory: Effort normal and breath sounds normal.  GI: Soft. She exhibits no distension and no mass. There is no abdominal tenderness.  Musculoskeletal:        General: No edema.  Lymphadenopathy:    She has no cervical adenopathy.  Neurological: She is alert.  Skin: Skin is warm and dry.     Assessment/Plan Chronic cough. ?  Secondary to GERD. Diagnostic EGD and average risk screening colonoscopy.  Hildred Laser, MD 11/25/2018, 10:49 AM

## 2018-11-25 NOTE — Discharge Instructions (Signed)
Discontinue omeprazole and famotidine/Pepcid for now. Resume other medications as before including aspirin. Pantoprazole 40 mg by mouth 30 minutes before breakfast and evening meal daily.  Continue for total of 8 weeks.  If no improvement in the cough noted can go back to omeprazole and famotidine as before. High-fiber diet. No driving for 24 hours.   Colonoscopy, Adult, Care After This sheet gives you information about how to care for yourself after your procedure. Your health care provider may also give you more specific instructions. If you have problems or questions, contact your health care provider. Dr. Karilyn Cotaehman: [phone]: [phone]  What can I expect after the procedure? After the procedure, it is common to have:  A small amount of blood in your stool for 24 hours after the procedure.  Some gas.  Mild abdominal cramping or bloating. Follow these instructions at home: General instructions  For the first 24 hours after the procedure: ? Do not drive or use machinery. ? Do not sign important documents. ? Do not drink alcohol. ? Do your regular daily activities at a slower pace than normal.  Take over-the-counter or prescription medicines only as told by your health care provider. Relieving cramping and bloating   Try walking around when you have cramps or feel bloated.   Drink enough fluid to keep your urine pale yellow.  Resume your normal diet as instructed by your health care provider. Marland Kitchen.  Avoid drinking alcohol for 24 hours as instructed by your health care provider. Contact a health care provider if:  You have blood in your stool 2-3 days after the procedure. Get help right away if:  You have more than a small spotting of blood in your stool.  You pass large blood clots in your stool.  Your abdomen is swollen.  You have nausea or vomiting.  You have a fever.  You have increasing abdominal pain that is not relieved with medicine. Summary  After the procedure, it  is common to have a small amount of blood in your stool. You may also have mild abdominal cramping and bloating.  For the first 24 hours after the procedure, do not drive or use machinery, sign important documents, or drink alcohol.  Contact your health care provider if you have a lot of blood in your stool, nausea or vomiting, a fever, or increased abdominal pain. This information is not intended to replace advice given to you by your health care provider. Make sure you discuss any questions you have with your health care provider. Document Released: 09/26/2003 Document Revised: 12/04/2016 Document Reviewed: 04/25/2015 Elsevier Patient Education  2020 Else[employer] Inc.   Upper Endoscopy, Adult, Care After This sheet gives you information about how to care for yourself after your procedure. Your health care provider may also give you more specific instructions. If you have problems or questions, contact your health care provider. What can I expect after the procedure? After the procedure, it is common to have:  A sore throat.  Mild stomach pain or discomfort.  Bloating.  Nausea. Follow these instructions at home:   Follow instructions from your health care provider about what to eat or drink after your procedure.  Return to your normal activities as told by your health care provider. Ask your health care provider what activities are safe for you.  Take over-the-counter and prescription medicines only as told by your health care provider.  Do not drive for 24 hours if you were given a sedative during your procedure.  Keep all  follow-up visits as told by your health care provider. This is important. Contact a health care provider if you have:  A sore throat that lasts longer than one day.  Trouble swallowing. Get help right away if:  You vomit blood or your vomit looks like coffee grounds.  You have: ? A fever. ? Bloody, black, or tarry stools. ? A severe sore throat or you  cannot swallow. ? Difficulty breathing. ? Severe pain in your chest or abdomen. Summary  After the procedure, it is common to have a sore throat, mild stomach discomfort, bloating, and nausea.  Do not drive for 24 hours if you were given a sedative during the procedure.  Follow instructions from your health care provider about what to eat or drink after your procedure.  Return to your normal activities as told by your health care provider. This information is not intended to replace advice given to you by your health care provider. Make sure you discuss any questions you have with your health care provider. Document Released: 08/13/2011 Document Revised: 08/05/2017 Document Reviewed: 07/14/2017 Elsevier Patient Education  2020 Else[employer] Inc.    High-Fiber Diet Fiber, also called dietary fiber, is a type of carbohydrate that is found in fruits, vegetables, whole grains, and beans. A high-fiber diet can have many health benefits. Your health care provider may recommend a high-fiber diet to help:  Prevent constipation. Fiber can make your bowel movements more regular.  Lower your cholesterol.  Relieve the following conditions: ? Swelling of veins in the anus (hemorrhoids). ? Swelling and irritation (inflammation) of specific areas of the digestive tract (uncomplicated diverticulosis). ? A problem of the large intestine (colon) that sometimes causes pain and diarrhea (irritable bowel syndrome, IBS).  Prevent overeating as part of a weight-loss plan.  Prevent heart disease, type 2 diabetes, and certain cancers. What is my plan? The recommended daily fiber intake in grams (g) includes:  38 g for men age 79 or younger.  30 g for men over age 60.  25 g for women age 22 or younger.  21 g for women over age 107. You can get the recommended daily intake of dietary fiber by:  Eating a variety of fruits, vegetables, grains, and beans.  Taking a fiber supplement, if it is not possible  to get enough fiber through your diet. What do I need to know about a high-fiber diet?  It is better to get fiber through food sources rather than from fiber supplements. There is not a lot of research about how effective supplements are.  Always check the fiber content on the nutrition facts label of any prepackaged food. Look for foods that contain 5 g of fiber or more per serving.  Talk with a diet and nutrition specialist (dietitian) if you have questions about specific foods that are recommended or not recommended for your medical condition, especially if those foods are not listed below.  Gradually increase how much fiber you consume. If you increase your intake of dietary fiber too quickly, you may have bloating, cramping, or gas.  Drink plenty of water. Water helps you to digest fiber. What are tips for following this plan?  Eat a wide variety of high-fiber foods.  Make sure that half of the grains that you eat each day are whole grains.  Eat breads and cereals that are made with whole-grain flour instead of refined flour or white flour.  Eat brown rice, bulgur wheat, or millet instead of white rice.  Start the  day with a breakfast that is high in fiber, such as a cereal that contains 5 g of fiber or more per serving.  Use beans in place of meat in soups, salads, and pasta dishes.  Eat high-fiber snacks, such as berries, raw vegetables, nuts, and popcorn.  Choose whole fruits and vegetables instead of processed forms like juice or sauce. What foods can I eat?  Fruits Berries. Pears. Apples. Oranges. Avocado. Prunes and raisins. Dried figs. Vegetables Sweet potatoes. Spinach. Kale. Artichokes. Cabbage. Broccoli. Cauliflower. Green peas. Carrots. Squash. Grains Whole-grain breads. Multigrain cereal. Oats and oatmeal. Brown rice. Barley. Bulgur wheat. [place]. Quinoa. Bran muffins. Popcorn. Rye wafer crackers. Meats and other proteins Navy, kidney, and pinto beans. Soybeans.  Split peas. Lentils. Nuts and seeds. Dairy Fiber-fortified yogurt. Beverages Fiber-fortified soy milk. Fiber-fortified orange juice. Other foods Fiber bars. The items listed above may not be a complete list of recommended foods and beverages. Contact a dietitian for more options. What foods are not recommended? Fruits Fruit juice. Cooked, strained fruit. Vegetables Fried potatoes. Canned vegetables. Well-cooked vegetables. Grains White bread. Pasta made with refined flour. White rice. Meats and other proteins Fatty cuts of meat. Fried chicken or fried fish. Dairy Milk. Yogurt. Cream cheese. Sour cream. Fats and oils Butters. Beverages Soft drinks. Other foods Cakes and pastries. The items listed above may not be a complete list of foods and beverages to avoid. Contact a dietitian for more information. Summary  Fiber is a type of carbohydrate. It is found in fruits, vegetables, whole grains, and beans.  There are many health benefits of eating a high-fiber diet, such as preventing constipation, lowering blood cholesterol, helping with weight loss, and reducing your risk of heart disease, diabetes, and certain cancers.  Gradually increase your intake of fiber. Increasing too fast can result in cramping, bloating, and gas. Drink plenty of water while you increase your fiber.  The best sources of fiber include whole fruits and vegetables, whole grains, nuts, seeds, and beans. This information is not intended to replace advice given to you by your health care provider. Make sure you discuss any questions you have with your health care provider. Document Released: 02/11/2005 Document Revised: 12/16/2016 Document Reviewed: 12/16/2016 Elsevier Patient Education  2020 [employer].

## 2018-11-25 NOTE — Op Note (Signed)
[facility] Patient Name: Whitney Gill Procedure Date: 11/25/2018 11:09 AM MRN: [id number] Date of Birth: 1945-07-22 Attending MD: Lionel December , MD CSN: [id number] Age: 73 Admit Type: Outpatient Procedure:                Colonoscopy Indications:              Screening for colorectal malignant neoplasm Providers:                Lionel December, MD, Judee Clara, RN, Edythe Clarity,                            Technician Referring MD:             Selinda Flavin, MD Medicines:                Midazolam 3 mg IV Complications:            No immediate complications. Estimated Blood Loss:     Estimated blood loss: none. Procedure:                Pre-Anesthesia Assessment:                           - Prior to the procedure, a History and Physical                            was performed, and patient medications and                            allergies were reviewed. The patient's tolerance of                            previous anesthesia was also reviewed. The risks                            and benefits of the procedure and the sedation                            options and risks were discussed with the patient.                            All questions were answered, and informed consent                            was obtained. Prior Anticoagulants: The patient has                            taken no previous anticoagulant or antiplatelet                            agents except for aspirin. ASA Grade Assessment: II                            - A patient with mild systemic disease. After  reviewing the risks and benefits, the patient was                            deemed in satisfactory condition to undergo the                            procedure.                           - Prior to the procedure, a History and Physical                            was performed, and patient medications and                            allergies were reviewed. The patient's  tolerance of                            previous anesthesia was also reviewed. The risks                            and benefits of the procedure and the sedation                            options and risks were discussed with the patient.                            All questions were answered, and informed consent                            was obtained. Prior Anticoagulants: The patient has                            taken no previous anticoagulant or antiplatelet                            agents except for aspirin. ASA Grade Assessment: II                            - A patient with mild systemic disease. After                            reviewing the risks and benefits, the patient was                            deemed in satisfactory condition to undergo the                            procedure.                           After obtaining informed consent, the colonoscope  was passed under direct vision. Throughout the                            procedure, the patient's blood pressure, pulse, and                            oxygen saturations were monitored continuously. The                            [id number]-[id number] ([id number]) scope was introduced through                            the anus and advanced to the the cecum, identified                            by appendiceal orifice and ileocecal valve. The                            colonoscopy was performed without difficulty. The                            patient tolerated the procedure well. The quality                            of the bowel preparation was good. The ileocecal                            valve, appendiceal orifice, and rectum were                            photographed. Scope In: 11:14:50 AM Scope Out: 11:32:18 AM Scope Withdrawal Time: 0 hours 7 minutes 55 seconds  Total Procedure Duration: 0 hours 17 minutes 28 seconds  Findings:      Skin tags were found on perianal exam.      Scattered  medium-mouthed diverticula were found in the sigmoid colon.      The exam was otherwise normal throughout the examined colon.      Anal papilla(e) were hypertrophied. Impression:               - Perianal skin tags found on perianal exam.                           - Diverticulosis in the sigmoid colon.                           - Anal papilla(e) were hypertrophied.                           - No specimens collected. Moderate Sedation:      Moderate (conscious) sedation was administered by the endoscopy nurse       and supervised by the endoscopist. The following parameters were       monitored: oxygen saturation, heart rate, blood pressure, CO2       capnography and response to care. Total physician intraservice time was  31 minutes. Recommendation:           - Patient has a contact number available for                            emergencies. The signs and symptoms of potential                            delayed complications were discussed with the                            patient. Return to normal activities tomorrow.                            Written discharge instructions were provided to the                            patient.                           - High fiber diet and diabetic (ADA) diet today.                           - Continue present medications.                           - No repeat colonoscopy due to age and the absence                            of advanced adenomas. Procedure Code(s):        --- Professional ---                           [phone], Colonoscopy, flexible; diagnostic, including                            collection of specimen(s) by brushing or washing,                            when performed (separate procedure)                           99153, Moderate sedation; each additional 15                            minutes intraservice time                           G0500, Moderate sedation services provided by the                            same  physician or other qualified health care                            professional performing a gastrointestinal  endoscopic service that sedation supports,                            requiring the presence of an independent trained                            observer to assist in the monitoring of the                            patient's level of consciousness and physiological                            status; initial 15 minutes of intra-service time;                            patient age 31 years or older (additional time may                            be reported with [id number], as appropriate) Diagnosis Code(s):        --- Professional ---                           Z12.11, Encounter for screening for malignant                            neoplasm of colon                           K62.89, Other specified diseases of anus and rectum                           K64.4, Residual hemorrhoidal skin tags                           K57.30, Diverticulosis of large intestine without                            perforation or abscess without bleeding CPT copyright 2019 American [employer]. All rights reserved. The codes documented in this report are preliminary and upon coder review may  be revised to meet current compliance requirements. Lionel DecemberNajeeb Jadon Ressler, MD Lionel DecemberNajeeb Motty Borin, MD 11/25/2018 11:46:21 AM This report has been signed electronically. Number of Addenda: 0

## 2018-12-01 ENCOUNTER — Encounter (HOSPITAL_COMMUNITY): Payer: Self-pay | Admitting: Internal Medicine

## 2019-01-13 ENCOUNTER — Other Ambulatory Visit (INDEPENDENT_AMBULATORY_CARE_PROVIDER_SITE_OTHER): Payer: Self-pay | Admitting: Internal Medicine

## 2019-03-23 ENCOUNTER — Ambulatory Visit: Payer: Medicare Other

## 2019-04-09 ENCOUNTER — Ambulatory Visit: Payer: Medicare Other

## 2019-07-08 ENCOUNTER — Other Ambulatory Visit (INDEPENDENT_AMBULATORY_CARE_PROVIDER_SITE_OTHER): Payer: Self-pay | Admitting: Internal Medicine

## 2019-07-08 NOTE — Telephone Encounter (Signed)
Patient will need to make appointment prior to further refills. She has not been seen in a year. Patient may also get from her PCP.

## 2019-10-11 ENCOUNTER — Ambulatory Visit (INDEPENDENT_AMBULATORY_CARE_PROVIDER_SITE_OTHER): Payer: Medicare Other | Admitting: Gastroenterology

## 2019-10-25 ENCOUNTER — Ambulatory Visit (INDEPENDENT_AMBULATORY_CARE_PROVIDER_SITE_OTHER): Payer: Medicare Other | Admitting: Gastroenterology

## 2019-10-26 ENCOUNTER — Ambulatory Visit: Payer: Medicare Other | Admitting: Orthopaedic Surgery

## 2019-11-03 ENCOUNTER — Ambulatory Visit: Payer: Medicare PPO | Admitting: Orthopaedic Surgery

## 2019-11-09 ENCOUNTER — Ambulatory Visit: Payer: Medicare Other | Admitting: Dermatology

## 2019-11-09 ENCOUNTER — Other Ambulatory Visit: Payer: Self-pay

## 2019-11-09 ENCOUNTER — Encounter: Payer: Self-pay | Admitting: Dermatology

## 2019-11-09 DIAGNOSIS — L720 Epidermal cyst: Secondary | ICD-10-CM

## 2019-11-09 DIAGNOSIS — Z1283 Encounter for screening for malignant neoplasm of skin: Secondary | ICD-10-CM

## 2019-11-09 DIAGNOSIS — L821 Other seborrheic keratosis: Secondary | ICD-10-CM

## 2019-11-09 DIAGNOSIS — L814 Other melanin hyperpigmentation: Secondary | ICD-10-CM

## 2019-11-09 NOTE — Patient Instructions (Addendum)
Routine follow-up (last one being 2 years ago) for Whitney Gill.  She had  some concern about 2 spots on her face.  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  On the right side of the nose is a pink-tan macule which represents a solar lentigo and would be safe to watch as long as it stable.  The white bump on the center of the upper nose is a milium (mini-cyst); most of these persist but it is possible will disappear.  There are multiple keratoses on the torso arm and leg which do not require freezing or removal.  There are no atypical moles or signs of skin cancer.  I encouraged Kendal Hymen to check her own skin a couple of times a year and I be delighted to continue to do a general skin check annually.

## 2019-11-11 ENCOUNTER — Other Ambulatory Visit: Payer: Self-pay

## 2019-11-11 ENCOUNTER — Ambulatory Visit (INDEPENDENT_AMBULATORY_CARE_PROVIDER_SITE_OTHER): Payer: Medicare PPO | Admitting: Orthopaedic Surgery

## 2019-11-11 VITALS — Ht 67.0 in | Wt 187.0 lb

## 2019-11-11 DIAGNOSIS — M1611 Unilateral primary osteoarthritis, right hip: Secondary | ICD-10-CM | POA: Insufficient documentation

## 2019-11-11 NOTE — Progress Notes (Signed)
Office Visit Note   Patient: Whitney Gill           Date of Birth: [birth year]           MRN: [id number] Visit Date: 11/11/2019              Requested by: Selinda Flavin, MD [address] [place],  [place] [address] PCP: Selinda Flavin, MD   Assessment & Plan: Visit Diagnoses:  1. Unilateral primary osteoarthritis, right hip     Plan: It is my opinion that she would benefit from a total hip arthroplasty given the combination of findings that she has on clinical exam combined with her signs and symptoms and the success she has had with intra-articular injections in the past.  I think her bursitis is coming from her gait being thrown off from right hip pain but also dealing with chronic issues with her left ankle.  I had a long thorough discussion with her about her hip.  We will see her back in about a month from now when she has improved from her bronchitis and she will have had her labs checked for hemoglobin A1c.  When I do see her back in 4 weeks I would like a standing low AP pelvis and a lateral of her right hip.  All questions and concerns were answered and addressed.  Follow-Up Instructions: Return in about 4 weeks (around 12/09/2019).   Orders:  No orders of the defined types were placed in this encounter.  No orders of the defined types were placed in this encounter.     Procedures: No procedures performed   Clinical Data: No additional findings.   Subjective: Chief Complaint  Patient presents with  . Right Hip - Pain  Whitney Gill is a very pleasant 74 year old female comes in for second opinion as a relates to her right hip.  She has been dealing with both significant trochanteric bursitis but also moderate arthritis of her right hip and the status of her gait off.  She is also dealt with a significant left ankle injury that was remote that required 2 different operations.  This is also thrown off her gait.  She used to get steroid injections that are intra-articular in the hip  joint that did help, but it has gotten to the point intra-articular right hip joint injections have not helped.  Her last steroid injection of the trochanteric area caused her significant pain.  She has been going through physical therapy as well.  She is frustrated.  She has been weakened recently by severe bronchitis.  She is also diabetic and reports that she has had excellent control up until recently.  She does have labs to be drawn coming up on October 4.  Her husband is with her today as well.  She has a hard time getting around with her right hip pain.  At this point it is detriment affecting her mobility, her quality of life, and her activities of daily living.  HPI  Review of Systems Other than her recent bouts of bronchitis that have caused weakness and shortness of breath, she denies any fever, chills, nausea, vomiting  Objective: Vital Signs: Ht 5\' 7"  (1.702 m)   Wt 187 lb (84.8 kg)   BMI 29.29 kg/m   Physical Exam She is alert and orient x3 and in no acute distress Ortho Exam Examination of her right hip shows pain with internal and external rotation as well as stiffness with rotation.  There is some  pain to palpation of the trochanteric area as well.  Her left hip moves more smoothly. Specialty Comments:  No specialty comments available.  Imaging: No results found. The MRI that does accompany her of her right hip does show moderate arthritic changes.  There is edema in the acetabular roof and cystic changes.  There is slight flattening of the femoral head and signs of femoral acetabular impingement as well.  There is no significant joint effusion.  PMFS History: Patient Active Problem List   Diagnosis Date Noted  . Unilateral primary osteoarthritis, right hip 11/11/2019  . Special screening for malignant neoplasms, colon 08/05/2018  . Cough 08/05/2018  . GERD (gastroesophageal reflux disease) 08/12/2014  . Essential hypertension 08/12/2014  . Upper airway cough syndrome  08/11/2014   Past Medical History:  Diagnosis Date  . Diabetes mellitus without complication (HCC)   . Hypertension   . Hypothyroidism     Family History  Problem Relation Age of Onset  . COPD Mother        smoked  . Heart attack Father   . Diabetes Father   . Breast cancer Maternal Aunt     Past Surgical History:  Procedure Laterality Date  . BREAST BIOPSY    . COLONOSCOPY N/A 11/25/2018   Procedure: COLONOSCOPY;  Surgeon: Malissa Hippo, MD;  Location: [facility];  Service: Endoscopy;  Laterality: N/A;  1030  . ESOPHAGOGASTRODUODENOSCOPY N/A 11/25/2018   Procedure: ESOPHAGOGASTRODUODENOSCOPY (EGD);  Surgeon: Malissa Hippo, MD;  Location: [facility];  Service: Endoscopy;  Laterality: N/A;  . KNEE ARTHROSCOPY     rt  . L ankle surgery    . SHOULDER SURGERY    . TONSILLECTOMY     Social History   Occupational History  . Not on file  Tobacco Use  . Smoking status: Never Smoker  . Smokeless tobacco: Never Used  Vaping Use  . Vaping Use: Never used  Substance and Sexual Activity  . Alcohol use: No    Alcohol/week: 0.0 standard drinks  . Drug use: No  . Sexual activity: Not on file

## 2019-12-01 ENCOUNTER — Telehealth (INDEPENDENT_AMBULATORY_CARE_PROVIDER_SITE_OTHER): Payer: Self-pay | Admitting: Gastroenterology

## 2019-12-01 NOTE — Telephone Encounter (Signed)
Please notify patient I sent a refill of Protonix to pharmacy.  She was due for her yearly follow-up September 2021 - she should make an appointment for further refills

## 2019-12-01 NOTE — Telephone Encounter (Signed)
Patient is aware of the refill and will call back to make an appointment for f/u

## 2019-12-05 NOTE — Progress Notes (Signed)
   Follow-Up Visit   Subjective  Whitney Gill is a 74 y.o. female who presents for the following: Annual Exam (tan spots on face seb k ?).  New spots Location: Face Duration:  Quality:  Associated Signs/Symptoms: Modifying Factors:  Severity:  Timing: Context: Wants other areas checked  Objective  Well appearing patient in no apparent distress; mood and affect are within normal limits.  A focused examination was performed including Head, neck, back, upper chest, arms, legs.. Relevant physical exam findings are noted in the Assessment and Plan.   Assessment & Plan    Skin exam for malignant neoplasm Mid Back  Annual skin examination  Lentigines Left Nasal Sidewall  Leave if stable  Routine follow-up (last one being 2 years ago) for Whitney Gill.  She had  some concern about 2 spots on her face.  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  On the right side of the nose is a pink-tan macule which represents a solar lentigo and would be safe to watch as long as it stable.  The white bump on the center of the upper nose is a milium (mini-cyst); most of these persist but it is possible will disappear.  There are multiple keratoses on the torso arm and leg which do not require freezing or removal.  There are no atypical moles or signs of skin cancer.  I encouraged Whitney Gill to check her own skin a couple of times a year and I be delighted to continue to do a general skin check annually.   I, Janalyn Harder, MD, have reviewed all documentation for this visit.  The documentation on 12/05/19 for the exam, diagnosis, procedures, and orders are all accurate and complete.

## 2019-12-09 ENCOUNTER — Ambulatory Visit (INDEPENDENT_AMBULATORY_CARE_PROVIDER_SITE_OTHER): Payer: Medicare PPO

## 2019-12-09 ENCOUNTER — Ambulatory Visit: Payer: Medicare PPO | Admitting: Orthopaedic Surgery

## 2019-12-09 DIAGNOSIS — M1611 Unilateral primary osteoarthritis, right hip: Secondary | ICD-10-CM

## 2019-12-09 NOTE — Progress Notes (Signed)
The patient is here today to continue to discuss hip replacement surgery of her right hip. She has known severe osteoarthritis of the right hip and moderate of her left hip. It has become debilitating and is detrimentally affecting her mobility, her quality of life and her actives daily living. She has had multiple steroid injections in the right hip which used to help but now they do not help. She is interested in hip replacement surgery. We have held off on this due to her getting over bronchitis. She has been vaccinated for COVID-19 but has held off on a booster given the fact that she had such bad bronchitis they felt may have been Covid related. She is now better from that. Her hemoglobin A1c recently was below 7. She reports much better blood glucose control. She is been working in therapy getting her hip stronger and working on stretching.  Examination of the right and left hip shows significant pain along with internal and external rotation and significant stiffness. A low AP pelvis and lateral both hips shows joint space narrowing with particular osteophytes on both hips.  At this point I agree with her proceeding with right total hip arthroplasty. I explained in detail what the surgery involves. I gave her handout about it and we talked about the interoperative and postoperative course. I showed her hip model as well and described the risk and benefits of the surgery. She will continue therapy until we can get her set up for the surgery hopefully in the near future.

## 2019-12-13 ENCOUNTER — Encounter (INDEPENDENT_AMBULATORY_CARE_PROVIDER_SITE_OTHER): Payer: Self-pay | Admitting: Gastroenterology

## 2019-12-13 ENCOUNTER — Other Ambulatory Visit: Payer: Self-pay

## 2019-12-13 ENCOUNTER — Ambulatory Visit (INDEPENDENT_AMBULATORY_CARE_PROVIDER_SITE_OTHER): Payer: Medicare PPO | Admitting: Gastroenterology

## 2019-12-13 VITALS — BP 133/65 | HR 83 | Temp 98.9°F | Ht 67.0 in | Wt 183.7 lb

## 2019-12-13 DIAGNOSIS — K589 Irritable bowel syndrome without diarrhea: Secondary | ICD-10-CM

## 2019-12-13 DIAGNOSIS — K219 Gastro-esophageal reflux disease without esophagitis: Secondary | ICD-10-CM

## 2019-12-13 MED ORDER — PANTOPRAZOLE SODIUM 40 MG PO TBEC: 40.0000 mg | DELAYED_RELEASE_TABLET | Freq: Every day | ORAL | 3 refills | Status: DC

## 2019-12-13 NOTE — Progress Notes (Signed)
Patient profile: Whitney Gill is a 74 y.o. female seen for follow up.  History of Present Illness: Whitney Gill is seen today for follow-up of GERD.  She reports being started on reflux medication for a chronic cough.  She has been on omeprazole and famotidine in the past and more recently last year was started on Protonix 40 mg twice a day.  She reports her cough over the past year has been slightly improved.  She did have a severe flare of cough when she was diagnosed with Covid in September 2021.  She had severe fatigue, was treated with inhalers Z-Pak steroids etc.  Did not require hospitalization.  She continues to not feel any acid reflux.  She denies any nausea vomiting or dysphagia.  No early satiety or epigastric pain. She remains upright after meals.   Bowels are typically daily, she occasionally has loose explosive stools, happens a few times a year but can be problematic due to incontinence.  She takes Imodium prior to doing errands which helps prevent issues.  Otherwise she has regular stools without any blood in stool. No abd pain. Trying to loose weight for blood sugar control.    Wt Readings from Last 3 Encounters:  12/13/19 183 lb 11.2 oz (83.3 kg)  11/11/19 187 lb (84.8 kg)  11/25/18 187 lb (84.8 kg)     Last Colonoscopy: 10/2018- Perianal skin tags found on perianal exam.  - Diverticulosis in the sigmoid colon.  - Anal papilla(e) were hypertrophied.  - No specimens collected    Last Endoscopy: 10/2018-- Normal hypopharynx.  - Normal esophagus.  - Z-line irregular, 40 cm from the incisors.  - Normal stomach. - Normal duodenal bulb and second portion of the duodenum.  - No specimens collected.     Past Medical History:  Past Medical History:  Diagnosis Date  . Diabetes mellitus without complication (HCC)   . Hypertension   . Hypothyroidism     Problem List: Patient Active Problem List   Diagnosis Date Noted  . Unilateral primary osteoarthritis,  right hip 11/11/2019  . Special screening for malignant neoplasms, colon 08/05/2018  . Cough 08/05/2018  . GERD (gastroesophageal reflux disease) 08/12/2014  . Essential hypertension 08/12/2014  . Upper airway cough syndrome 08/11/2014    Past Surgical History: Past Surgical History:  Procedure Laterality Date  . BREAST BIOPSY    . COLONOSCOPY N/A 11/25/2018   Procedure: COLONOSCOPY;  Surgeon: Malissa Hippo, MD;  Location: [facility];  Service: Endoscopy;  Laterality: N/A;  1030  . ESOPHAGOGASTRODUODENOSCOPY N/A 11/25/2018   Procedure: ESOPHAGOGASTRODUODENOSCOPY (EGD);  Surgeon: Malissa Hippo, MD;  Location: [facility];  Service: Endoscopy;  Laterality: N/A;  . KNEE ARTHROSCOPY     rt  . L ankle surgery    . SHOULDER SURGERY    . TONSILLECTOMY      Allergies: Allergies  Allergen Reactions  . Prednisone Palpitations    Heart racing & turns red      Home Medications:  Current Outpatient Medications:  .  acetaminophen (TYLENOL) 650 MG CR tablet, Take 650-1,300 mg by mouth every 8 (eight) hours as needed for pain., Disp: , Rfl:  .  Albuterol Sulfate (PROAIR HFA IN), Inhale into the lungs. As needed., Disp: , Rfl:  .  aspirin EC 81 MG tablet, Take 81 mg by mouth at bedtime. , Disp: , Rfl:  .  atorvastatin (LIPITOR) 20 MG tablet, Take 20 mg by mouth 2 (two) times a week.  Mondays & Thursdays nights., Disp: , Rfl:  .  Calcium Carb-Cholecalciferol (CALTRATE 600+D3 PO), Take 1 tablet by mouth daily., Disp: , Rfl:  .  Coenzyme Q10 (COQ10) 200 MG CAPS, Take 200 mg by mouth at bedtime. , Disp: , Rfl:  .  fluticasone (FLONASE) 50 MCG/ACT nasal spray, Place 2 sprays into both nostrils daily as needed (allergies.). , Disp: , Rfl:  .  fluticasone (FLONASE) 50 MCG/ACT nasal spray, Place into both nostrils daily., Disp: , Rfl:  .  hydrochlorothiazide (MICROZIDE) 12.5 MG capsule, Take 12.5 mg by mouth daily., Disp: , Rfl:  .  levothyroxine (SYNTHROID, LEVOTHROID) 75 MCG tablet, Take 75  mcg by mouth daily before breakfast. , Disp: , Rfl:  .  losartan (COZAAR) 25 MG tablet, Take 25 mg by mouth daily., Disp: , Rfl:  .  metFORMIN (GLUCOPHAGE-XR) 500 MG 24 hr tablet, Take 500 mg by mouth 2 (two) times daily., Disp: , Rfl:  .  Multiple Vitamin (MULTIVITAMIN WITH MINERALS) TABS tablet, Take 1 tablet by mouth daily. Centrum Silver, Disp: , Rfl:  .  Polyethyl Glycol-Propyl Glycol (LUBRICANT EYE DROPS) 0.4-0.3 % SOLN, Place 1 drop into both eyes 4 (four) times daily as needed (dry/irritated eyes.)., Disp: , Rfl:  .  Probiotic Product (ALIGN) 4 MG CAPS, Take 4 mg by mouth daily. , Disp: , Rfl:  .  TURMERIC PO, Take 1 capsule by mouth at bedtime. , Disp: , Rfl:  .  Vitamin D, Cholecalciferol, 25 MCG (1000 UT) CAPS, Take by mouth daily., Disp: , Rfl:  .  pantoprazole (PROTONIX) 40 MG tablet, Take 1 tablet (40 mg total) by mouth daily. Take 30 min before meal, Disp: 90 tablet, Rfl: 3   Family History: family history includes Breast cancer in her maternal aunt; COPD in her mother; Diabetes in her father; Heart attack in her father.    Social History:   reports that she has never smoked. She has never used smokeless tobacco. She reports that she does not drink alcohol and does not use drugs.   Review of Systems: Constitutional: Denies weight loss/weight gain  Eyes: No changes in vision. ENT: No oral lesions, sore throat.  GI: see HPI.  Heme/Lymph: No easy bruising.  CV: No chest pain.  GU: No hematuria.  Integumentary: No rashes.  Neuro: No headaches.  Psych: No depression/anxiety.  Endocrine: No heat/cold intolerance.  Allergic/Immunologic: No urticaria.  Resp: No cough, SOB.  Musculoskeletal: No joint swelling.    Physical Examination: BP 133/65 (BP Location: Right Arm, Patient Position: Sitting, Cuff Size: Normal)   Pulse 83   Temp 98.9 F (37.2 C) (Oral)   Ht 5\' 7"  (1.702 m)   Wt 183 lb 11.2 oz (83.3 kg)   BMI 28.77 kg/m  Gen: NAD, alert and oriented x 4 HEENT:  PEERLA, EOMI, Neck: supple, no JVD Chest: CTA bilaterally, no wheezes, crackles, or other adventitious sounds CV: RRR, no m/g/c/r Abd: soft, NT, ND, +BS in all four quadrants; no HSM, guarding, ridigity, or rebound tenderness Ext: no edema, well perfused with 2+ pulses, Skin: no rash or lesions noted on observed skin Lymph: no noted LAD  Data Reviewed:   Labs reviewed-12/04/2019-CBC with MCV 101, hemoglobin 13.4, otherwise normal.  CMP with glucose 169.  Hemoglobin A1c 6.4, TSH normal.  Vitamin D 86.9.  B12 457.  Assessment/Plan: Ms. Gill is a 74 y.o. female for follow-up of GERD  1. GERD -she is currently on 40 mg twice a day Protonix for chronic cough.  Will decrease to once a day 40mg , reflux modifications reviewed and she is not feeling any symptoms.  Upper endoscopy last year negative for Barrett's. Consider 20mg  daily in future if does well w/ initial dose decrease  2.  IBS-D-very occasional symptoms problematic if doing errands.  She uses Imodium with good control.  Reviewed could use antispasmodic in future, will call if needs this.    Whitney was seen today for follow-up.  Diagnoses and all orders for this visit:  Chronic GERD  Irritable bowel syndrome, unspecified type  Other orders -     pantoprazole (PROTONIX) 40 MG tablet; Take 1 tablet (40 mg total) by mouth daily. Take 30 min before meal    1 year f/up, sooner if needed    I personally performed the service, non-incident to. (WP)  , [facility]

## 2019-12-13 NOTE — Patient Instructions (Signed)
We are decreasing protonix to 40mg  once a day - take 30 min before meals. Okay to use imodium as needed for occasional diarrhea.

## 2020-01-17 ENCOUNTER — Other Ambulatory Visit: Payer: Self-pay

## 2020-02-28 ENCOUNTER — Other Ambulatory Visit (HOSPITAL_COMMUNITY): Payer: Medicare PPO

## 2020-02-28 NOTE — Patient Instructions (Signed)
DUE TO COVID-19 ONLY ONE VISITOR IS ALLOWED TO COME WITH YOU AND STAY IN THE WAITING ROOM ONLY DURING PRE OP AND PROCEDURE DAY OF SURGERY. THE 1 VISITOR  MAY VISIT WITH YOU AFTER SURGERY IN YOUR PRIVATE ROOM DURING VISITING HOURS ONLY!  YOU NEED TO HAVE A COVID 19 TEST ON__1/4_____ @__10 :55_____, THIS TEST MUST BE DONE BEFORE SURGERY,  COVID TESTING SITE [place] [id number], IT IS ON THE RIGHT GOING OUT [address] APPROXIMATELY  2 MINUTES PAST [employer] ON THE RIGHT. ONCE YOUR COVID TEST IS COMPLETED,  PLEASE BEGIN THE QUARANTINE INSTRUCTIONS AS OUTLINED IN YOUR HANDOUT.                Whitney Gill    Your procedure is scheduled on: 03/04/19   Report to [facility] Main  Entrance   Report to admitting at   8:30 AM     Call this number if you have problems the morning of surgery [phone]   No food after midnight.    You may have clear liquid until 8:00  AM.    At 7:30 AM drink pre surgery drink  . Nothing by mouth after 8:00 AM.   BRUSH YOUR TEETH MORNING OF SURGERY AND RINSE YOUR MOUTH OUT, NO CHEWING GUM CANDY OR MINTS.     Take these medicines the morning of surgery with A SIP OF WATER: Levothyroxine, Pantoprozole use inhaler and bring it with you to the hospital  DO NOT [place]   How to Manage Your Diabetes Before and After Surgery  Why is it important to control my blood sugar before and after surgery? . Improving blood sugar levels before and after surgery helps healing and can limit problems. . A way of improving blood sugar control is eating a healthy diet by: o  Eating less sugar and carbohydrates o  Increasing activity/exercise o  Talking with your doctor about reaching your blood sugar goals . High blood sugars (greater than 180 mg/dL) can raise your risk of infections and slow your recovery, so you will need to focus on controlling your diabetes during the weeks before  surgery. . Make sure that the doctor who takes care of your diabetes knows about your planned surgery including the date and location.  How do I manage my blood sugar before surgery? . Check your blood sugar at least 4 times a day, starting 2 days before surgery, to make sure that the level is not too high or low. o Check your blood sugar the morning of your surgery when you wake up and every 2 hours until you get to the Short Stay unit. . If your blood sugar is less than 70 mg/dL, you will need to treat for low blood sugar: o Do not take insulin. o Treat a low blood sugar (less than 70 mg/dL) with  cup of clear juice (cranberry or apple), 4 glucose tablets, OR glucose gel. o Recheck blood sugar in 15 minutes after treatment (to make sure it is greater than 70 mg/dL). If your blood sugar is not greater than 70 mg/dL on recheck, call [phone] for further instructions. . Report your blood sugar to the short stay nurse when you get to Short Stay.  . If you are admitted to the hospital after surgery: o Your blood sugar will be checked by the staff and you will probably be given insulin after surgery (instead of oral diabetes medicines)  to make sure you have good blood sugar levels. o The goal for blood sugar control after surgery is 80-180 mg/dL.   WHAT DO I DO ABOUT MY DIABETES MEDICATION?  Marland Kitchen Do not take oral diabetes medicines (pills) the morning of surgery.  .                               You may not have any metal on your body including hair pins and              piercings  Do not wear jewelry, make-up, lotions, powders or perfumes, deodorant             Do not wear nail polish on your fingernails.  Do not shave  48 hours prior to surgery.     Do not bring valuables to the hospital. [place] IS NOT             RESPONSIBLE   FOR VALUABLES.  Contacts, dentures or bridgework may not be worn into surgery.       Patients discharged the day of surgery will not be allowed to  drive home.  IF YOU ARE HAVING SURGERY AND GOING HOME THE SAME DAY, YOU MUST HAVE AN ADULT TO DRIVE YOU HOME AND BE WITH YOU FOR 24 HOURS. YOU MAY GO HOME BY TAXI OR UBER OR ORTHERWISE, BUT AN ADULT MUST ACCOMPANY YOU HOME AND STAY WITH YOU FOR 24 HOURS.  Name and phone number of your driver:  Special Instructions: N/A              Please read over the following fact sheets you were given: _____________________________________________________________________             [facility] - Preparing for Surgery Before surgery, you can play an important role.   Because skin is not sterile, your skin needs to be as free of germs as possible.   You can reduce the number of germs on your skin by washing with CHG (chlorahexidine gluconate) soap before surgery .  CHG is an antiseptic cleaner which kills germs and bonds with the skin to continue killing germs even after washing. Please DO NOT use if you have an allergy to CHG or antibacterial soaps.   If your skin becomes reddened/irritated stop using the CHG and inform your nurse when you arrive at Short Stay. Do not shave (including legs and underarms) for at least 48 hours prior to the first CHG shower.   Please follow these instructions carefully:  1.  Shower with CHG Soap the night before surgery and the  morning of Surgery.  2.  If you choose to wash your hair, wash your hair first as usual with your  normal  shampoo.  3.  After you shampoo, rinse your hair and body thoroughly to remove the  shampoo.                                        4.  Use CHG as you would any other liquid soap.  You can apply chg directly  to the skin and wash                       Gently with a scrungie or clean washcloth.  5.  Apply the CHG Soap to your body ONLY  FROM THE NECK DOWN.   Do not use on face/ open                           Wound or open sores. Avoid contact with eyes, ears mouth and genitals (private parts).                       Wash face,  Genitals  (private parts) with your normal soap.             6.  Wash thoroughly, paying special attention to the area where your surgery  will be performed.  7.  Thoroughly rinse your body with warm water from the neck down.  8.  DO NOT shower/wash with your normal soap after using and rinsing off  the CHG Soap.             9.  Pat yourself dry with a clean towel.            10.  Wear clean pajamas.            11.  Place clean sheets on your bed the night of your first shower and do not  sleep with pets. Day of Surgery : Do not apply any lotions/deodorants the morning of surgery.  Please wear clean clothes to the hospital/surgery center.  FAILURE TO FOLLOW THESE INSTRUCTIONS MAY RESULT IN THE CANCELLATION OF YOUR SURGERY PATIENT SIGNATURE_________________________________  NURSE SIGNATURE__________________________________  ________________________________________________________________________   Rogelia Mire  An incentive spirometer is a tool that can help keep your lungs clear and active. This tool measures how well you are filling your lungs with each breath. Taking long deep breaths may help reverse or decrease the chance of developing breathing (pulmonary) problems (especially infection) following:  A long period of time when you are unable to move or be active. BEFORE THE PROCEDURE   If the spirometer includes an indicator to show your best effort, your nurse or respiratory therapist will set it to a desired goal.  If possible, sit up straight or lean slightly forward. Try not to slouch.  Hold the incentive spirometer in an upright position. INSTRUCTIONS FOR USE  1. Sit on the edge of your bed if possible, or sit up as far as you can in bed or on a chair. 2. Hold the incentive spirometer in an upright position. 3. Breathe out normally. 4. Place the mouthpiece in your mouth and seal your lips tightly around it. 5. Breathe in slowly and as deeply as possible, raising the piston  or the ball toward the top of the column. 6. Hold your breath for 3-5 seconds or for as long as possible. Allow the piston or ball to fall to the bottom of the column. 7. Remove the mouthpiece from your mouth and breathe out normally. 8. Rest for a few seconds and repeat Steps 1 through 7 at least 10 times every 1-2 hours when you are awake. Take your time and take a few normal breaths between deep breaths. 9. The spirometer may include an indicator to show your best effort. Use the indicator as a goal to work toward during each repetition. 10. After each set of 10 deep breaths, practice coughing to be sure your lungs are clear. If you have an incision (the cut made at the time of surgery), support your incision when coughing by placing a pillow or rolled up towels firmly against it. Once you are able  to get out of bed, walk around indoors and cough well. You may stop using the incentive spirometer when instructed by your caregiver.  RISKS AND COMPLICATIONS  Take your time so you do not get dizzy or light-headed.  If you are in pain, you may need to take or ask for pain medication before doing incentive spirometry. It is harder to take a deep breath if you are having pain. AFTER USE  Rest and breathe slowly and easily.  It can be helpful to keep track of a log of your progress. Your caregiver can provide you with a simple table to help with this. If you are using the spirometer at home, follow these instructions: SEEK MEDICAL CARE IF:   You are having difficultly using the spirometer.  You have trouble using the spirometer as often as instructed.  Your pain medication is not giving enough relief while using the spirometer.  You develop fever of 100.5 F (38.1 C) or higher. SEEK IMMEDIATE MEDICAL CARE IF:   You cough up bloody sputum that had not been present before.  You develop fever of 102 F (38.9 C) or greater.  You develop worsening pain at or near the incision site. MAKE  SURE YOU:   Understand these instructions.  Will watch your condition.  Will get help right away if you are not doing well or get worse. Document Released: 06/24/2006 Document Revised: 05/06/2011 Document Reviewed: 08/25/2006 [facility] Patient Information 2014 [place], [place].   ________________________________________________________________________

## 2020-02-29 ENCOUNTER — Encounter (HOSPITAL_COMMUNITY)
Admission: RE | Admit: 2020-02-29 | Discharge: 2020-02-29 | Disposition: A | Discharge status: Home / Self Care | Payer: Medicare PPO | Source: Ambulatory Visit | Attending: Orthopaedic Surgery | Admitting: Orthopaedic Surgery

## 2020-02-29 ENCOUNTER — Other Ambulatory Visit (HOSPITAL_COMMUNITY)
Admission: RE | Admit: 2020-02-29 | Discharge: 2020-02-29 | Disposition: A | Discharge status: Home / Self Care | Payer: Medicare PPO | Source: Ambulatory Visit | Attending: Orthopaedic Surgery | Admitting: Orthopaedic Surgery

## 2020-02-29 ENCOUNTER — Other Ambulatory Visit: Payer: Self-pay

## 2020-02-29 ENCOUNTER — Encounter (HOSPITAL_COMMUNITY): Payer: Self-pay

## 2020-02-29 DIAGNOSIS — Z01818 Encounter for other preprocedural examination: Secondary | ICD-10-CM | POA: Insufficient documentation

## 2020-02-29 DIAGNOSIS — Z20822 Contact with and (suspected) exposure to covid-19: Secondary | ICD-10-CM | POA: Insufficient documentation

## 2020-02-29 HISTORY — DX: Unspecified osteoarthritis, unspecified site: M19.90

## 2020-02-29 LAB — SURGICAL PCR SCREEN
MRSA, PCR: NEGATIVE
Staphylococcus aureus: NEGATIVE

## 2020-02-29 LAB — BASIC METABOLIC PANEL
Anion gap: 11 (ref 5–15)
BUN: 17 mg/dL (ref 8–23)
CO2: 25 mmol/L (ref 22–32)
Calcium: 9.2 mg/dL (ref 8.9–10.3)
Chloride: 102 mmol/L (ref 98–111)
Creatinine, Ser: 0.71 mg/dL (ref 0.44–1.00)
GFR, Estimated: >60 mL/min (ref >60)
Glucose, Bld: 219 mg/dL — ABNORMAL HIGH (ref 70–99)
Potassium: 3.8 mmol/L (ref 3.5–5.1)
Sodium: 138 mmol/L (ref 135–145)

## 2020-02-29 LAB — CBC
HCT: 37.6 % (ref 36.0–46.0)
Hemoglobin: 13.2 g/dL (ref 12.0–15.0)
MCH: 33.6 pg (ref 26.0–34.0)
MCHC: 35.1 g/dL (ref 30.0–36.0)
MCV: 95.7 fL (ref 80.0–100.0)
Platelets: 194 10*3/uL (ref 150–400)
RBC: 3.93 MIL/uL (ref 3.87–5.11)
RDW: 12.9 % (ref 11.5–15.5)
WBC: 6.8 10*3/uL (ref 4.0–10.5)
nRBC: 0 % (ref 0.0–0.2)

## 2020-02-29 LAB — HEMOGLOBIN A1C
Hgb A1c MFr Bld: 5.8 % — ABNORMAL HIGH (ref 4.8–5.6)
Mean Plasma Glucose: 119.76 mg/dL

## 2020-02-29 LAB — SARS CORONAVIRUS 2 (TAT 6-24 HRS): SARS Coronavirus 2: NEGATIVE

## 2020-02-29 NOTE — Progress Notes (Signed)
COVID Vaccine Completed:Yes Date COVID Vaccine completed:Feb 2021, booster- 02/21/20 COVID vaccine manufacturer: Pfizer      PCP - Dr. Charna Elizabeth Cardiologist - none  Chest x-ray - no EKG - 02/29/20-epic,chart Stress Test - no ECHO - no Cardiac Cath - no Pacemaker/ICD device last checked:NA  Sleep Study - yes-negative CPAP - no  Fasting Blood Sugar - 130-180 Checks Blood Sugar BID_____ times a day  Blood Thinner Instructions:[facility]/ Dr. Mayford Knife Aspirin Instructions:stop 5 days prior to DOS/ Magnus Ivan Last Dose:02/23/20  Anesthesia review:   Patient denies shortness of breath, fever, cough and chest pain at PAT appointment yes  Patient verbalized understanding of instructions that were given to them at the PAT appointment. Patient was also instructed that they will need to review over the PAT instructions again at home before surgery. Yes Pt is able to climb 2 flights of stairs, do housework and ADLs without SOB

## 2020-03-02 NOTE — H&P (Signed)
TOTAL HIP ADMISSION H&P  Patient is admitted for right total hip arthroplasty.  Subjective:  Chief Complaint: right hip pain  HPI: Whitney Gill, 75 y.o. female, has a history of pain and functional disability in the right hip(s) due to arthritis and patient has failed non-surgical conservative treatments for greater than 12 weeks to include NSAID's and/or analgesics, corticosteriod injections, flexibility and strengthening excercises, use of assistive devices, weight reduction as appropriate and activity modification.  Onset of symptoms was gradual starting 2 years ago with gradually worsening course since that time.The patient noted no past surgery on the right hip(s).  Patient currently rates pain in the right hip at 10 out of 10 with activity. Patient has night pain, worsening of pain with activity and weight bearing, trendelenberg gait, pain that interfers with activities of daily living and pain with passive range of motion. Patient has evidence of subchondral cysts, subchondral sclerosis, periarticular osteophytes and joint space narrowing by imaging studies. This condition presents safety issues increasing the risk of falls.  There is no current active infection.  Patient Active Problem List   Diagnosis Date Noted  . Unilateral primary osteoarthritis, right hip 11/11/2019  . Special screening for malignant neoplasms, colon 08/05/2018  . Cough 08/05/2018  . GERD (gastroesophageal reflux disease) 08/12/2014  . Essential hypertension 08/12/2014  . Upper airway cough syndrome 08/11/2014   Past Medical History:  Diagnosis Date  . Arthritis    hip, knee  . Diabetes mellitus without complication ([place])   . Hypertension   . Hypothyroidism     Past Surgical History:  Procedure Laterality Date  . BREAST BIOPSY    . COLONOSCOPY N/A 11/25/2018   Procedure: COLONOSCOPY;  Surgeon: Rogene Houston, MD;  Location: [facility];  Service: Endoscopy;  Laterality: N/A;  1030  .  ESOPHAGOGASTRODUODENOSCOPY N/A 11/25/2018   Procedure: ESOPHAGOGASTRODUODENOSCOPY (EGD);  Surgeon: Rogene Houston, MD;  Location: [facility];  Service: Endoscopy;  Laterality: N/A;  . KNEE ARTHROSCOPY Right 1985  . L ankle surgery Left 2007,2010,2011  . SHOULDER SURGERY Right 1998  . TONSILLECTOMY      No current facility-administered medications for this encounter.   Current Outpatient Medications  Medication Sig Dispense Refill Last Dose  . acetaminophen (TYLENOL) 650 MG CR tablet Take 650-1,300 mg by mouth every 8 (eight) hours as needed for pain.     Marland Kitchen albuterol (VENTOLIN HFA) 108 (90 Base) MCG/ACT inhaler Inhale 2 puffs into the lungs 4 (four) times daily as needed (whezzing /  sob). As needed.     Marland Kitchen aspirin EC 81 MG tablet Take 81 mg by mouth at bedtime.      Marland Kitchen atorvastatin (LIPITOR) 20 MG tablet Take 20 mg by mouth every Monday, Wednesday, and Friday.     . Calcium Carb-Cholecalciferol (CALTRATE 600+D3 PO) Take 1 tablet by mouth daily.     . Coenzyme Q10 (COQ10) 200 MG CAPS Take 200 mg by mouth at bedtime.      . fluticasone (FLONASE) 50 MCG/ACT nasal spray Place 2 sprays into both nostrils daily as needed (allergies.).      Marland Kitchen hydrochlorothiazide (MICROZIDE) 12.5 MG capsule Take 12.5 mg by mouth daily.     Marland Kitchen ipratropium (ATROVENT) 0.06 % nasal spray Place 2 sprays into the nose 4 (four) times daily as needed.     Marland Kitchen levothyroxine (SYNTHROID, LEVOTHROID) 75 MCG tablet Take 75 mcg by mouth daily before breakfast.      . losartan (COZAAR) 25 MG tablet Take  25 mg by mouth daily.     . Menthol, Topical Analgesic, (BIOFREEZE) 4 % GEL Apply 1 application topically daily as needed (pain).     . metFORMIN (GLUCOPHAGE-XR) 500 MG 24 hr tablet Take 500 mg by mouth 2 (two) times daily.     . Multiple Vitamin (MULTIVITAMIN WITH MINERALS) TABS tablet Take 1 tablet by mouth daily. Centrum Silver     . OVER THE COUNTER MEDICATION Apply 1 application topically daily as needed (pain). CBD Salve      . pantoprazole (PROTONIX) 20 MG tablet Take 20 mg by mouth at bedtime.     Bertram Gala Glycol-Propyl Glycol (LUBRICANT EYE DROPS) 0.4-0.3 % SOLN Place 1 drop into both eyes 4 (four) times daily as needed (dry/irritated eyes.).     Marland Kitchen Probiotic Product (ALIGN) 4 MG CAPS Take 4 mg by mouth daily.      . Turmeric 500 MG CAPS Take 500 mg by mouth at bedtime.     . Vitamin D, Cholecalciferol, 25 MCG (1000 UT) CAPS Take 1,000 Units by mouth in the morning and at bedtime.      Allergies  Allergen Reactions  . Prednisone Palpitations    Heart racing & turns red    Social History   Tobacco Use  . Smoking status: Never Smoker  . Smokeless tobacco: Never Used  Substance Use Topics  . Alcohol use: No    Alcohol/week: 0.0 standard drinks    Family History  Problem Relation Age of Onset  . COPD Mother        smoked  . Heart attack Father   . Diabetes Father   . Breast cancer Maternal Aunt      Review of Systems  Musculoskeletal: Positive for gait problem.  All other systems reviewed and are negative.   Objective:  Physical Exam Vitals reviewed.  Constitutional:      Appearance: Normal appearance.  HENT:     Head: Normocephalic and atraumatic.  Eyes:     Extraocular Movements: Extraocular movements intact.     Pupils: Pupils are equal, round, and reactive to light.  Cardiovascular:     Rate and Rhythm: Normal rate.     Pulses: Normal pulses.  Pulmonary:     Effort: Pulmonary effort is normal.  Abdominal:     Palpations: Abdomen is soft.  Musculoskeletal:     Cervical back: Normal range of motion.     Right hip: Tenderness and bony tenderness present. Decreased range of motion. Decreased strength.  Neurological:     Mental Status: She is alert and oriented to person, place, and time.  Psychiatric:        Behavior: Behavior normal.     Vital signs in last 24 hours:    Labs:   Estimated body mass index is 29.09 kg/m as calculated from the following:   Height as of  02/29/20: 5' 6.5" (1.689 m).   Weight as of 02/29/20: 83 kg.   Imaging Review Plain radiographs demonstrate severe degenerative joint disease of the right hip(s). The bone quality appears to be good for age and reported activity level.      Assessment/Plan:  End stage arthritis, right hip(s)  The patient history, physical examination, clinical judgement of the provider and imaging studies are consistent with end stage degenerative joint disease of the right hip(s) and total hip arthroplasty is deemed medically necessary. The treatment options including medical management, injection therapy, arthroscopy and arthroplasty were discussed at length. The risks and benefits of  total hip arthroplasty were presented and reviewed. The risks due to aseptic loosening, infection, stiffness, dislocation/subluxation,  thromboembolic complications and other imponderables were discussed.  The patient acknowledged the explanation, agreed to proceed with the plan and consent was signed. Patient is being admitted for inpatient treatment for surgery, pain control, PT, OT, prophylactic antibiotics, VTE prophylaxis, progressive ambulation and ADL's and discharge planning.The patient is planning to be discharged home with home health services

## 2020-03-03 ENCOUNTER — Other Ambulatory Visit: Payer: Self-pay

## 2020-03-03 ENCOUNTER — Ambulatory Visit (HOSPITAL_COMMUNITY): Payer: Medicare PPO | Admitting: Certified Registered Nurse Anesthetist

## 2020-03-03 ENCOUNTER — Encounter (HOSPITAL_COMMUNITY): Payer: Self-pay | Admitting: Orthopaedic Surgery

## 2020-03-03 ENCOUNTER — Ambulatory Visit (HOSPITAL_COMMUNITY): Payer: Medicare PPO

## 2020-03-03 ENCOUNTER — Encounter (HOSPITAL_COMMUNITY)
Admission: RE | Disposition: A | Discharge status: Home / Self Care | Payer: Self-pay | Source: Ambulatory Visit | Attending: Orthopaedic Surgery

## 2020-03-03 ENCOUNTER — Observation Stay (HOSPITAL_COMMUNITY): Payer: Medicare PPO

## 2020-03-03 ENCOUNTER — Observation Stay (HOSPITAL_COMMUNITY)
Admission: RE | Admit: 2020-03-03 | Discharge: 2020-03-05 | Disposition: A | Discharge status: Home / Self Care | Payer: Medicare PPO | Source: Ambulatory Visit | Attending: Orthopaedic Surgery | Admitting: Orthopaedic Surgery

## 2020-03-03 DIAGNOSIS — Z96641 Presence of right artificial hip joint: Secondary | ICD-10-CM

## 2020-03-03 DIAGNOSIS — Z7982 Long term (current) use of aspirin: Secondary | ICD-10-CM | POA: Diagnosis not present

## 2020-03-03 DIAGNOSIS — Z79899 Other long term (current) drug therapy: Secondary | ICD-10-CM | POA: Diagnosis not present

## 2020-03-03 DIAGNOSIS — Z7984 Long term (current) use of oral hypoglycemic drugs: Secondary | ICD-10-CM | POA: Diagnosis not present

## 2020-03-03 DIAGNOSIS — E119 Type 2 diabetes mellitus without complications: Secondary | ICD-10-CM | POA: Diagnosis not present

## 2020-03-03 DIAGNOSIS — E039 Hypothyroidism, unspecified: Secondary | ICD-10-CM | POA: Insufficient documentation

## 2020-03-03 DIAGNOSIS — Z471 Aftercare following joint replacement surgery: Secondary | ICD-10-CM | POA: Diagnosis not present

## 2020-03-03 DIAGNOSIS — M1611 Unilateral primary osteoarthritis, right hip: Secondary | ICD-10-CM | POA: Diagnosis not present

## 2020-03-03 DIAGNOSIS — I10 Essential (primary) hypertension: Secondary | ICD-10-CM | POA: Diagnosis not present

## 2020-03-03 HISTORY — PX: TOTAL HIP ARTHROPLASTY: SHX124

## 2020-03-03 LAB — TYPE AND SCREEN
ABO/RH(D): O POS
Antibody Screen: NEGATIVE

## 2020-03-03 LAB — GLUCOSE, CAPILLARY
Glucose-Capillary: 161 mg/dL — ABNORMAL HIGH (ref 70–99)
Glucose-Capillary: 139 mg/dL — ABNORMAL HIGH (ref 70–99)
Glucose-Capillary: 201 mg/dL — ABNORMAL HIGH (ref 70–99)
Glucose-Capillary: 247 mg/dL — ABNORMAL HIGH (ref 70–99)

## 2020-03-03 LAB — ABO/RH: ABO/RH(D): O POS

## 2020-03-03 SURGERY — ARTHROPLASTY, HIP, TOTAL, ANTERIOR APPROACH
Anesthesia: Spinal | Site: Hip | Laterality: Right

## 2020-03-03 MED ORDER — METHOCARBAMOL 500 MG PO TABS: 500.0000 mg | ORAL_TABLET | Freq: Four times a day (QID) | ORAL | 1 refills | Status: DC | PRN

## 2020-03-03 MED ORDER — STERILE WATER FOR IRRIGATION IR SOLN
Status: DC | PRN
  Administered 2020-03-03: 2000 mL

## 2020-03-03 MED ORDER — METFORMIN HCL ER 500 MG PO TB24
500.0000 mg | ORAL_TABLET | Freq: Two times a day (BID) | ORAL | Status: DC
  Administered 2020-03-03 – 2020-03-05 (×4): 500 mg via ORAL
  Filled 2020-03-03 (×4): qty 1

## 2020-03-03 MED ORDER — LACTATED RINGERS IV SOLN: INTRAVENOUS | Status: DC

## 2020-03-03 MED ORDER — OXYCODONE HCL 5 MG PO TABS
5.0000 mg | ORAL_TABLET | ORAL | Status: DC | PRN
  Administered 2020-03-03 – 2020-03-05 (×7): 10 mg via ORAL
  Filled 2020-03-03 (×5): qty 2

## 2020-03-03 MED ORDER — POVIDONE-IODINE 10 % EX SWAB
2.0000 "application " | Freq: Once | CUTANEOUS | Status: AC
  Administered 2020-03-03: 2 via TOPICAL

## 2020-03-03 MED ORDER — INSULIN ASPART 100 UNIT/ML ~~LOC~~ SOLN
0.0000 [IU] | Freq: Every day | SUBCUTANEOUS | Status: DC
  Administered 2020-03-03: 2 [IU] via SUBCUTANEOUS

## 2020-03-03 MED ORDER — TRANEXAMIC ACID-NACL 1000-0.7 MG/100ML-% IV SOLN
INTRAVENOUS | Status: DC | PRN
  Administered 2020-03-03: 1000 mg via INTRAVENOUS

## 2020-03-03 MED ORDER — PHENOL 1.4 % MT LIQD: 1.0000 | OROMUCOSAL | Status: DC | PRN

## 2020-03-03 MED ORDER — METHOCARBAMOL 500 MG PO TABS
500.0000 mg | ORAL_TABLET | Freq: Four times a day (QID) | ORAL | Status: DC | PRN
  Administered 2020-03-03 – 2020-03-05 (×5): 500 mg via ORAL
  Filled 2020-03-03 (×6): qty 1

## 2020-03-03 MED ORDER — AMISULPRIDE (ANTIEMETIC) 5 MG/2ML IV SOLN: 10.0000 mg | Freq: Once | INTRAVENOUS | Status: DC | PRN

## 2020-03-03 MED ORDER — HYDROMORPHONE HCL 1 MG/ML IJ SOLN
0.5000 mg | INTRAMUSCULAR | Status: DC | PRN
  Administered 2020-03-04: 1 mg via INTRAVENOUS
  Filled 2020-03-03 (×2): qty 1

## 2020-03-03 MED ORDER — OXYCODONE HCL 5 MG PO TABS: 5.0000 mg | ORAL_TABLET | ORAL | 0 refills | Status: DC | PRN

## 2020-03-03 MED ORDER — DEXAMETHASONE SODIUM PHOSPHATE 10 MG/ML IJ SOLN
INTRAMUSCULAR | Status: DC | PRN
  Administered 2020-03-03: 5 mg via INTRAVENOUS

## 2020-03-03 MED ORDER — ONDANSETRON HCL 4 MG PO TABS
4.0000 mg | ORAL_TABLET | Freq: Four times a day (QID) | ORAL | Status: DC | PRN
  Administered 2020-03-04 – 2020-03-05 (×2): 4 mg via ORAL
  Filled 2020-03-03 (×2): qty 1

## 2020-03-03 MED ORDER — OXYCODONE HCL 5 MG PO TABS: 5.0000 mg | ORAL_TABLET | Freq: Once | ORAL | Status: DC | PRN

## 2020-03-03 MED ORDER — CEFAZOLIN SODIUM-DEXTROSE 1-4 GM/50ML-% IV SOLN
1.0000 g | Freq: Four times a day (QID) | INTRAVENOUS | Status: AC
  Administered 2020-03-03 (×2): 1 g via INTRAVENOUS
  Filled 2020-03-03 (×2): qty 50

## 2020-03-03 MED ORDER — ACETAMINOPHEN 325 MG PO TABS
325.0000 mg | ORAL_TABLET | Freq: Four times a day (QID) | ORAL | Status: DC | PRN
  Administered 2020-03-05 (×2): 650 mg via ORAL
  Filled 2020-03-03 (×2): qty 2

## 2020-03-03 MED ORDER — PANTOPRAZOLE SODIUM 40 MG PO TBEC
40.0000 mg | DELAYED_RELEASE_TABLET | Freq: Every day | ORAL | Status: DC
  Administered 2020-03-03 – 2020-03-05 (×3): 40 mg via ORAL
  Filled 2020-03-03 (×3): qty 1

## 2020-03-03 MED ORDER — PROPOFOL 500 MG/50ML IV EMUL
INTRAVENOUS | Status: DC | PRN
  Administered 2020-03-03: 125 ug/kg/min via INTRAVENOUS

## 2020-03-03 MED ORDER — LOSARTAN POTASSIUM 25 MG PO TABS
25.0000 mg | ORAL_TABLET | Freq: Every day | ORAL | Status: DC
  Administered 2020-03-03 – 2020-03-05 (×3): 25 mg via ORAL
  Filled 2020-03-03 (×3): qty 1

## 2020-03-03 MED ORDER — CEFAZOLIN SODIUM-DEXTROSE 2-4 GM/100ML-% IV SOLN
2.0000 g | INTRAVENOUS | Status: AC
  Administered 2020-03-03: 2 g via INTRAVENOUS
  Filled 2020-03-03: qty 100

## 2020-03-03 MED ORDER — 0.9 % SODIUM CHLORIDE (POUR BTL) OPTIME
TOPICAL | Status: DC | PRN
  Administered 2020-03-03: 1000 mL

## 2020-03-03 MED ORDER — CHLORHEXIDINE GLUCONATE 0.12 % MT SOLN
15.0000 mL | Freq: Once | OROMUCOSAL | Status: AC
  Administered 2020-03-03: 15 mL via OROMUCOSAL

## 2020-03-03 MED ORDER — OXYCODONE HCL 5 MG PO TABS
10.0000 mg | ORAL_TABLET | ORAL | Status: DC | PRN
  Administered 2020-03-03: 15 mg via ORAL
  Administered 2020-03-04: 10 mg via ORAL
  Filled 2020-03-03 (×2): qty 3
  Filled 2020-03-03 (×2): qty 2

## 2020-03-03 MED ORDER — ALUM & MAG HYDROXIDE-SIMETH 200-200-20 MG/5ML PO SUSP: 30.0000 mL | ORAL | Status: DC | PRN

## 2020-03-03 MED ORDER — POLYETHYL GLYCOL-PROPYL GLYCOL 0.4-0.3 % OP SOLN: 1.0000 [drp] | Freq: Four times a day (QID) | OPHTHALMIC | Status: DC | PRN

## 2020-03-03 MED ORDER — ADULT MULTIVITAMIN W/MINERALS CH
1.0000 | ORAL_TABLET | Freq: Every day | ORAL | Status: DC
  Administered 2020-03-03 – 2020-03-05 (×3): 1 via ORAL
  Filled 2020-03-03 (×3): qty 1

## 2020-03-03 MED ORDER — DOCUSATE SODIUM 100 MG PO CAPS
100.0000 mg | ORAL_CAPSULE | Freq: Two times a day (BID) | ORAL | Status: DC
  Administered 2020-03-03 – 2020-03-05 (×4): 100 mg via ORAL
  Filled 2020-03-03 (×4): qty 1

## 2020-03-03 MED ORDER — TRANEXAMIC ACID-NACL 1000-0.7 MG/100ML-% IV SOLN
INTRAVENOUS | Status: AC
  Filled 2020-03-03: qty 100

## 2020-03-03 MED ORDER — FENTANYL CITRATE (PF) 100 MCG/2ML IJ SOLN: 25.0000 ug | INTRAMUSCULAR | Status: DC | PRN

## 2020-03-03 MED ORDER — ONDANSETRON HCL 4 MG/2ML IJ SOLN: 4.0000 mg | Freq: Once | INTRAMUSCULAR | Status: DC | PRN

## 2020-03-03 MED ORDER — ATORVASTATIN CALCIUM 20 MG PO TABS
20.0000 mg | ORAL_TABLET | ORAL | Status: DC
  Administered 2020-03-03: 20 mg via ORAL
  Filled 2020-03-03 (×2): qty 1

## 2020-03-03 MED ORDER — PROPOFOL 1000 MG/100ML IV EMUL
INTRAVENOUS | Status: AC
  Filled 2020-03-03: qty 100

## 2020-03-03 MED ORDER — MENTHOL 3 MG MT LOZG: 1.0000 | LOZENGE | OROMUCOSAL | Status: DC | PRN

## 2020-03-03 MED ORDER — COQ10 200 MG PO CAPS: 200.0000 mg | ORAL_CAPSULE | Freq: Every day | ORAL | Status: DC

## 2020-03-03 MED ORDER — ONDANSETRON HCL 4 MG/2ML IJ SOLN
INTRAMUSCULAR | Status: DC | PRN
  Administered 2020-03-03: 4 mg via INTRAVENOUS

## 2020-03-03 MED ORDER — HYDROCHLOROTHIAZIDE 12.5 MG PO CAPS
12.5000 mg | ORAL_CAPSULE | Freq: Every day | ORAL | Status: DC
  Administered 2020-03-03 – 2020-03-05 (×3): 12.5 mg via ORAL
  Filled 2020-03-03 (×3): qty 1

## 2020-03-03 MED ORDER — ASPIRIN 81 MG PO CHEW
81.0000 mg | CHEWABLE_TABLET | Freq: Two times a day (BID) | ORAL | Status: DC
  Administered 2020-03-03 – 2020-03-05 (×4): 81 mg via ORAL
  Filled 2020-03-03 (×4): qty 1

## 2020-03-03 MED ORDER — BUPIVACAINE IN DEXTROSE 0.75-8.25 % IT SOLN
INTRATHECAL | Status: DC | PRN
  Administered 2020-03-03: 1.6 mL via INTRATHECAL

## 2020-03-03 MED ORDER — METOCLOPRAMIDE HCL 5 MG/ML IJ SOLN
5.0000 mg | Freq: Three times a day (TID) | INTRAMUSCULAR | Status: DC | PRN
  Administered 2020-03-05: 10 mg via INTRAVENOUS
  Filled 2020-03-03: qty 2

## 2020-03-03 MED ORDER — ALBUTEROL SULFATE HFA 108 (90 BASE) MCG/ACT IN AERS: 2.0000 | INHALATION_SPRAY | Freq: Four times a day (QID) | RESPIRATORY_TRACT | Status: DC | PRN

## 2020-03-03 MED ORDER — DIPHENHYDRAMINE HCL 12.5 MG/5ML PO ELIX: 12.5000 mg | ORAL_SOLUTION | ORAL | Status: DC | PRN

## 2020-03-03 MED ORDER — OXYCODONE HCL 5 MG/5ML PO SOLN: 5.0000 mg | Freq: Once | ORAL | Status: DC | PRN

## 2020-03-03 MED ORDER — PHENYLEPHRINE HCL-NACL 10-0.9 MG/250ML-% IV SOLN
INTRAVENOUS | Status: AC
  Filled 2020-03-03: qty 250

## 2020-03-03 MED ORDER — DEXAMETHASONE SODIUM PHOSPHATE 10 MG/ML IJ SOLN
INTRAMUSCULAR | Status: AC
  Filled 2020-03-03: qty 1

## 2020-03-03 MED ORDER — METHOCARBAMOL 1000 MG/10ML IJ SOLN
500.0000 mg | Freq: Four times a day (QID) | INTRAVENOUS | Status: DC | PRN
  Filled 2020-03-03: qty 5

## 2020-03-03 MED ORDER — ASPIRIN 81 MG PO CHEW: 81.0000 mg | CHEWABLE_TABLET | Freq: Two times a day (BID) | ORAL | 0 refills | Status: DC

## 2020-03-03 MED ORDER — FENTANYL CITRATE (PF) 100 MCG/2ML IJ SOLN
INTRAMUSCULAR | Status: DC | PRN
  Administered 2020-03-03 (×2): 50 ug via INTRAVENOUS

## 2020-03-03 MED ORDER — ONDANSETRON HCL 4 MG/2ML IJ SOLN
INTRAMUSCULAR | Status: AC
  Filled 2020-03-03: qty 2

## 2020-03-03 MED ORDER — FENTANYL CITRATE (PF) 100 MCG/2ML IJ SOLN
INTRAMUSCULAR | Status: AC
  Filled 2020-03-03: qty 2

## 2020-03-03 MED ORDER — IPRATROPIUM BROMIDE 0.06 % NA SOLN: 2.0000 | Freq: Four times a day (QID) | NASAL | Status: DC | PRN

## 2020-03-03 MED ORDER — SODIUM CHLORIDE 0.9 % IV SOLN: INTRAVENOUS | Status: DC

## 2020-03-03 MED ORDER — SODIUM CHLORIDE 0.9 % IR SOLN
Status: DC | PRN
  Administered 2020-03-03: 1000 mL

## 2020-03-03 MED ORDER — ONDANSETRON HCL 4 MG/2ML IJ SOLN: 4.0000 mg | Freq: Four times a day (QID) | INTRAMUSCULAR | Status: DC | PRN

## 2020-03-03 MED ORDER — LEVOTHYROXINE SODIUM 75 MCG PO TABS
75.0000 ug | ORAL_TABLET | Freq: Every day | ORAL | Status: DC
  Administered 2020-03-04 – 2020-03-05 (×2): 75 ug via ORAL
  Filled 2020-03-03 (×2): qty 1

## 2020-03-03 MED ORDER — POLYVINYL ALCOHOL 1.4 % OP SOLN: 1.0000 [drp] | OPHTHALMIC | Status: DC | PRN

## 2020-03-03 MED ORDER — ORAL CARE MOUTH RINSE: 15.0000 mL | Freq: Once | OROMUCOSAL | Status: AC

## 2020-03-03 MED ORDER — PROPOFOL 10 MG/ML IV BOLUS
INTRAVENOUS | Status: DC | PRN
  Administered 2020-03-03 (×2): 20 mg via INTRAVENOUS

## 2020-03-03 MED ORDER — INSULIN ASPART 100 UNIT/ML ~~LOC~~ SOLN
0.0000 [IU] | Freq: Three times a day (TID) | SUBCUTANEOUS | Status: DC
  Administered 2020-03-03: 5 [IU] via SUBCUTANEOUS
  Administered 2020-03-04: 3 [IU] via SUBCUTANEOUS
  Administered 2020-03-04: 2 [IU] via SUBCUTANEOUS
  Administered 2020-03-04: 3 [IU] via SUBCUTANEOUS
  Administered 2020-03-05: 2 [IU] via SUBCUTANEOUS
  Administered 2020-03-05: 3 [IU] via SUBCUTANEOUS

## 2020-03-03 MED ORDER — RISAQUAD PO CAPS
1.0000 | ORAL_CAPSULE | Freq: Every day | ORAL | Status: DC
  Administered 2020-03-03 – 2020-03-05 (×3): 1 via ORAL
  Filled 2020-03-03 (×3): qty 1

## 2020-03-03 MED ORDER — METOCLOPRAMIDE HCL 5 MG PO TABS
5.0000 mg | ORAL_TABLET | Freq: Three times a day (TID) | ORAL | Status: DC | PRN
Start: 2020-03-03 — End: 2020-03-05

## 2020-03-03 MED ORDER — PHENYLEPHRINE HCL-NACL 10-0.9 MG/250ML-% IV SOLN
INTRAVENOUS | Status: DC | PRN
  Administered 2020-03-03: 50 ug/min via INTRAVENOUS

## 2020-03-03 SURGICAL SUPPLY — 39 items
BAG ZIPLOCK 12X15 (MISCELLANEOUS) IMPLANT
BENZOIN TINCTURE PRP APPL 2/3 (GAUZE/BANDAGES/DRESSINGS) IMPLANT
BLADE SAW SGTL 18X1.27X75 (BLADE) ×2 IMPLANT
COVER PERINEAL POST (MISCELLANEOUS) ×2 IMPLANT
COVER SURGICAL LIGHT HANDLE (MISCELLANEOUS) ×2 IMPLANT
COVER WAND RF STERILE (DRAPES) ×2 IMPLANT
CUP SECTOR GRIPTON 50MM (Cup) ×2 IMPLANT
DRAPE STERI IOBAN 125X83 (DRAPES) ×2 IMPLANT
DRAPE U-SHAPE 47X51 STRL (DRAPES) ×4 IMPLANT
DRSG AQUACEL AG ADV 3.5X 4 (GAUZE/BANDAGES/DRESSINGS) ×2 IMPLANT
DRSG AQUACEL AG ADV 3.5X10 (GAUZE/BANDAGES/DRESSINGS) ×2 IMPLANT
DURAPREP 26ML APPLICATOR (WOUND CARE) ×2 IMPLANT
ELECT REM PT RETURN 15FT ADLT (MISCELLANEOUS) IMPLANT
GAUZE XEROFORM 1X8 LF (GAUZE/BANDAGES/DRESSINGS) ×2 IMPLANT
GLOVE BIO SURGEON STRL SZ7.5 (GLOVE) ×2 IMPLANT
GLOVE ECLIPSE 8.0 STRL XLNG CF (GLOVE) ×2 IMPLANT
GLOVE SRG 8 PF TXTR STRL LF DI (GLOVE) ×2 IMPLANT
GLOVE SURG UNDER POLY LF SZ8 (GLOVE) ×2
GOWN STRL REUS W/TWL XL LVL3 (GOWN DISPOSABLE) ×4 IMPLANT
HANDPIECE INTERPULSE COAX TIP (DISPOSABLE) ×1
HEAD FEM STD 32X+1 STRL (Hips) ×2 IMPLANT
HOLDER FOLEY CATH W/STRAP (MISCELLANEOUS) ×2 IMPLANT
KIT TURNOVER KIT A (KITS) IMPLANT
LINER ACETABULAR 32X50 (Liner) ×2 IMPLANT
PACK ANTERIOR HIP CUSTOM (KITS) ×2 IMPLANT
PENCIL SMOKE EVACUATOR (MISCELLANEOUS) IMPLANT
SET HNDPC FAN SPRY TIP SCT (DISPOSABLE) ×1 IMPLANT
STAPLER VISISTAT 35W (STAPLE) IMPLANT
STEM CORAIL KA09 (Stem) ×2 IMPLANT
STRIP CLOSURE SKIN 1/2X4 (GAUZE/BANDAGES/DRESSINGS) IMPLANT
SUT ETHIBOND NAB CT1 #1 30IN (SUTURE) ×2 IMPLANT
SUT ETHILON 2 0 PS N (SUTURE) IMPLANT
SUT MNCRL AB 4-0 PS2 18 (SUTURE) IMPLANT
SUT VIC AB 0 CT1 36 (SUTURE) ×2 IMPLANT
SUT VIC AB 1 CT1 36 (SUTURE) ×2 IMPLANT
SUT VIC AB 2-0 CT1 27 (SUTURE) ×2
SUT VIC AB 2-0 CT1 TAPERPNT 27 (SUTURE) ×2 IMPLANT
TRAY FOLEY MTR SLVR 14FR STAT (SET/KITS/TRAYS/PACK) ×2 IMPLANT
TRAY FOLEY MTR SLVR 16FR STAT (SET/KITS/TRAYS/PACK) IMPLANT

## 2020-03-03 NOTE — Brief Op Note (Signed)
03/03/2020  12:28 PM  PATIENT:  Karna Dupes  75 y.o. female  PRE-OPERATIVE DIAGNOSIS:  Osteoarthritis Right Hip  POST-OPERATIVE DIAGNOSIS:  Osteoarthritis Right Hip  PROCEDURE:  Procedure(s): RIGHT TOTAL HIP ARTHROPLASTY ANTERIOR APPROACH (Right)  SURGEON:  Surgeon(s) and Role:    Kathryne Hitch, MD - Primary  PHYSICIAN ASSISTANT:  Rexene Edison, PA-C  ANESTHESIA:   spinal  EBL:  100 mL   COUNTS:  YES  DICTATION: .Other Dictation: Dictation Number [id number]  PLAN OF CARE: Admit for overnight observation  PATIENT DISPOSITION:  PACU - hemodynamically stable.   Delay start of Pharmacological VTE agent (>24hrs) due to surgical blood loss or risk of bleeding: no

## 2020-03-03 NOTE — Interval H&P Note (Signed)
History and Physical Interval Note: The patient understands that she is here today for a right total hip arthroplasty to treat the pain from her right hip osteoarthritis.  There is been no recent acute change in her medical status.  See recent H&P.  The risk and benefits of surgery have been explained in detail and informed consent is obtained.  The right operative hip has been marked.  03/03/2020 10:06 AM  Whitney Gill  has presented today for surgery, with the diagnosis of Osteoarthritis Right Hip.  The various methods of treatment have been discussed with the patient and family. After consideration of risks, benefits and other options for treatment, the patient has consented to  Procedure(s): RIGHT TOTAL HIP ARTHROPLASTY ANTERIOR APPROACH (Right) as a surgical intervention.  The patient's history has been reviewed, patient examined, no change in status, stable for surgery.  I have reviewed the patient's chart and labs.  Questions were answered to the patient's satisfaction.     Kathryne Hitch

## 2020-03-03 NOTE — Evaluation (Signed)
Physical Therapy Evaluation Patient Details Name: Whitney Gill MRN: [id number] DOB: 25-Nov-1945 Today's Date: 03/03/2020   History of Present Illness  Patient is 75 y.o. female s/p Rt THA anterior approach on 03/03/20 with PMH significant for HTN, DM, OA, hypothyroidism.    Clinical Impression  LAURIE PENADO is a 75 y.o. female POD 0 s/p Rt THA. Patient reports independence with mobility at baseline. Patient is now limited by functional impairments (see PT problem list below) and requires min assist for transfers and gait with RW. Patient was able to ambulate ~18 feet with RW and min assist and was limited by pain. Patient instructed in exercise to facilitate circulation. Patient will benefit from continued skilled PT interventions to address impairments and progress towards PLOF. Acute PT will follow to progress mobility and stair training in preparation for safe discharge home.     Follow Up Recommendations Follow surgeon's recommendation for DC plan and follow-up therapies;Home health PT    Equipment Recommendations  None recommended by PT    Recommendations for Other Services       Precautions / Restrictions Precautions Precautions: Fall Restrictions Weight Bearing Restrictions: No Other Position/Activity Restrictions: WBAT      Mobility  Bed Mobility Overal bed mobility: Needs Assistance Bed Mobility: Supine to Sit     Supine to sit: Min assist;HOB elevated     General bed mobility comments: cues to use bed rail to help and assist to mobilize Rt LE to EOB.    Transfers Overall transfer level: Needs assistance Equipment used: Rolling walker (2 wheeled) Transfers: Sit to/from Stand Sit to Stand: Min assist            Ambulation/Gait Ambulation/Gait assistance: Min Chemical engineer (Feet): 18 Feet Assistive device: Rolling walker (2 wheeled) Gait Pattern/deviations: Step-to pattern;Decreased stride length;Decreased weight shift to right;Trunk flexed Gait  velocity: decr   General Gait Details: Cues for step pattern and proximity to RW; assist to manage walker position. distance limited by pain in Rt hip.  Stairs            Wheelchair Mobility    Modified Rankin (Stroke Patients Only)       Balance Overall balance assessment: Needs assistance Sitting-balance support: Feet supported Sitting balance-Leahy Scale: Good     Standing balance support: During functional activity;Bilateral upper extremity supported Standing balance-Leahy Scale: Fair                               Pertinent Vitals/Pain Pain Assessment: 0-10 Pain Score: 9  Pain Location: Rt hip Pain Descriptors / Indicators: Aching;Discomfort Pain Intervention(s): Limited activity within patient's tolerance;Monitored during session;Repositioned;Ice applied;Patient requesting pain meds-RN notified    Home Living Family/patient expects to be discharged to:: Private residence Living Arrangements: Spouse/significant other Available Help at Discharge: Family Type of Home: House Home Access: Stairs to enter Entrance Stairs-Rails: None Entrance Stairs-Number of Steps: 2 small deep steps Home Layout: One level Home Equipment: Walker - 2 wheels;Cane - single point;Bedside commode;Transport chair      Prior Function Level of Independence: Independent               Hand Dominance   Dominant Hand: Right    Extremity/Trunk Assessment   Upper Extremity Assessment Upper Extremity Assessment: Overall WFL for tasks assessed    Lower Extremity Assessment Lower Extremity Assessment: Overall WFL for tasks assessed    Cervical / Trunk Assessment Cervical / Trunk Assessment: Normal  Communication   Communication: No difficulties  Cognition Arousal/Alertness: Awake/alert Behavior During Therapy: WFL for tasks assessed/performed Overall Cognitive Status: Within Functional Limits for tasks assessed                                         General Comments      Exercises Total Joint Exercises Ankle Circles/Pumps: AROM;Both;Seated;20 reps   Assessment/Plan    PT Assessment Patient needs continued PT services  PT Problem List Decreased strength;Decreased range of motion;Decreased activity tolerance;Decreased balance;Decreased mobility;Decreased knowledge of use of DME;Pain       PT Treatment Interventions DME instruction;Gait training;Stair training;Functional mobility training;Therapeutic activities;Therapeutic exercise;Balance training;Patient/family education    PT Goals (Current goals can be found in the Care Plan section)  Acute Rehab PT Goals Patient Stated Goal: get back to indepedence and sleeping without pain PT Goal Formulation: With patient Time For Goal Achievement: 03/10/20 Potential to Achieve Goals: Good    Frequency 7X/week   Barriers to discharge        Co-evaluation               AM-PAC PT "6 Clicks" Mobility  Outcome Measure Help needed turning from your back to your side while in a flat bed without using bedrails?: A Little Help needed moving from lying on your back to sitting on the side of a flat bed without using bedrails?: A Little Help needed moving to and from a bed to a chair (including a wheelchair)?: A Little Help needed standing up from a chair using your arms (e.g., wheelchair or bedside chair)?: A Little Help needed to walk in hospital room?: A Little Help needed climbing 3-5 steps with a railing? : A Lot 6 Click Score: 17    End of Session Equipment Utilized During Treatment: Gait belt Activity Tolerance: Patient tolerated treatment well Patient left: in chair;with call bell/phone within reach;with chair alarm set;with family/visitor present Nurse Communication: Mobility status PT Visit Diagnosis: Muscle weakness (generalized) (M62.81);Difficulty in walking, not elsewhere classified (R26.2)    Time: 2440-1027 PT Time Calculation (min) (ACUTE ONLY): 16  min   Charges:   PT Evaluation $PT Eval Low Complexity: 1 Low          Wynn Maudlin, DPT Acute Rehabilitation Services Office [phone] Pager [phone]    Anitra Lauth 03/03/2020, 5:31 PM

## 2020-03-03 NOTE — Op Note (Signed)
NAME: Whitney Gill, SCHEWE MEDICAL RECORD [id number] ACCOUNT [id number] DATE OF BIRTH:01-02-1946 FACILITY: [facility] LOCATION: [facility] PHYSICIAN:Layanna Charo Aretha Parrot, MD  OPERATIVE REPORT  DATE OF PROCEDURE:  03/03/2020  PREOPERATIVE DIAGNOSES:  Primary osteoarthritis and degenerative joint disease, right hip.  POSTOPERATIVE DIAGNOSES:  Primary osteoarthritis and degenerative joint disease, right hip.  PROCEDURE:  Right total hip arthroplasty through direct anterior approach.  IMPLANTS:  DePuy Sector Gription acetabular component size 50, size 32+0 neutral polyethylene liner, size 9 Corail femoral component with standard offset, size 32+1 metal hip ball.  SURGEON:  Vanita Panda. Magnus Ivan, MD  ASSISTANT:  Richardean Canal, PA-C  ANESTHESIA:  Spinal.  ANTIBIOTICS:  Two grams IV Ancef.  ESTIMATED BLOOD LOSS:  100 mL.  COMPLICATIONS:  None.  INDICATIONS:  The patient is a very pleasant 75 year old female well known to me.  She has significant right hip pain and stiffness and x-rays showing osteoarthritis of the hip.  She has tried and failed all forms of conservative treatment including  steroid injections.  At this point, she wished to proceed with total hip arthroplasty and we agree with this considering her debilitating hip pain.  We had a long and thorough discussion about the risk of acute blood loss anemia, nerve or vessel injury,  fracture, infection, dislocation, DVT and implant failure.  We talked about skin and soft tissue issues as well.  We described the goals of being hopefully decreased pain, improve mobility and overall improve quality of life.  DESCRIPTION OF PROCEDURE:  After informed consent was obtained, appropriate right hip was marked.  She was brought to the operating room and sat up on a stretcher where spinal anesthesia was then obtained.  She was then laid in supine position on a  stretcher.  Foley catheter was placed and both feet had traction boots applied to  them.  Next, she was placed supine on the Hana fracture table, the perineal post in place and both legs in line skeletal traction device and no traction applied.  Her right  operative hip was prepped and draped with DuraPrep and sterile drapes.  A time-out was called and she was identified as correct patient, correct right hip.  I then made an incision just inferior and posterior to the anterior superior iliac spine and  carried this obliquely down the leg.  We dissected down tensor fascia lata muscle.  The tensor fascia was divided longitudinally to proceed with direct anterior approach to the hip.  We identified and cauterized circumflex vessels and identified the hip  capsule, opened the hip capsule in an L-type format finding a moderate joint effusion and significant periarticular osteophytes around the femoral head and neck.  We placed curved retractors within the medial and lateral femoral joint capsule and made  our femoral neck cut around the Cobra retractors at the femoral neck just proximal to the lesser trochanter.  We completed this with an osteotome.  We placed a corkscrew guide in the femoral head and removed the femoral head in its entirety and did find  a wide area devoid of cartilage.  I then placed a bent Hohmann over the medial acetabular rim and removed remnants of the acetabular labrum and other debris.  We then began reaming under direct visualization from a size 43 reamer in stepwise increments  up to a 49 reamer with all reamers under direct visualization, the last reamer was placed under direct fluoroscopy as well, so we could obtain our depth of reaming, our inclination and anteversion.  I then  placed the real DePuy Sector Gription acetabular  component size 50 and a 32+0 neutral polyethylene liner based on her offset and our position of the cup.  Attention was then turned to the femur.  With the leg externally rotated to 120 degrees, extended and adducted, we were able to place  Mueller  retractor medially and Hohmann retractor behind the greater trochanter.  We released lateral joint capsule and used a box-cutting osteotome to enter the femoral canal and a rongeur to lateralize.  We then began broaching using the Corail broaching system  from a size 8 just going up to a size 9 because the size 9 filled the canal.  We trialed a standard offset femoral neck and a 32+1 hip ball, reduced this in the acetabulum.  We definitely had increased her leg lengths, but we could not go any deeper  with the cup.  We went with a neutral liner and had lateralized the femoral component and got it down as low as I could.  I was not comfortable going with a short neck based off her offset.  We dislocated the hip and removed the trial components.  I  placed the real Corail femoral component size 9 and the real 32+1 metal hip ball and again reduced this in the acetabulum and it was very stable throughout its arc of motion and her offset was equal.  I felt like her leg lengths are little longer.  We  then irrigated the soft tissue with normal saline solution using pulsatile lavage.  We closed the joint capsule with interrupted #1 Ethibond suture, followed by running #1 Vicryl to close the tensor fascia, 0 Vicryl was used to close the deep tissue and  2-0 Vicryl was used to close the subcutaneous tissue.  The skin was closed with staples.  An Aquacel dressing was applied.  She was taken off the Hana table and taken to recovery room in stable condition.  All final counts being correct.  No  complications noted.  Of note, Rexene Edison, PA-C did assist during the entire case.  His assistance was crucial for facilitating all aspects of this case.  HN/NUANCE  D:03/03/2020 T:03/03/2020 JOB:[id number]

## 2020-03-03 NOTE — Progress Notes (Signed)

## 2020-03-03 NOTE — Discharge Instructions (Signed)

## 2020-03-03 NOTE — Anesthesia Preprocedure Evaluation (Addendum)
Anesthesia Evaluation  Patient identified by MRN, date of birth, ID band Patient awake    Reviewed: Allergy & Precautions, NPO status , Patient's Chart, lab work & pertinent test results  Airway Mallampati: II  TM Distance: >3 FB Neck ROM: Full    Dental no notable dental hx.    Pulmonary neg pulmonary ROS,    Pulmonary exam normal breath sounds clear to auscultation       Cardiovascular Exercise Tolerance: Good hypertension, Normal cardiovascular exam Rhythm:Regular Rate:Normal     Neuro/Psych negative neurological ROS  negative psych ROS   GI/Hepatic GERD  Medicated,  Endo/Other  diabetesHypothyroidism   Renal/GU   negative genitourinary   Musculoskeletal  (+) Arthritis ,   Abdominal Normal abdominal exam  (+)   Peds negative pediatric ROS (+)  Hematology negative hematology ROS (+)   Anesthesia Other Findings   Reproductive/Obstetrics negative OB ROS                            Anesthesia Physical Anesthesia Plan  ASA: III  Anesthesia Plan: Spinal   Post-op Pain Management:    Induction: Intravenous  PONV Risk Score and Plan: 2 and Ondansetron, Dexamethasone and Treatment may vary due to age or medical condition  Airway Management Planned: Natural Airway and Simple Face Mask  Additional Equipment: None  Intra-op Plan:   Post-operative Plan:   Informed Consent: I have reviewed the patients History and Physical, chart, labs and discussed the procedure including the risks, benefits and alternatives for the proposed anesthesia with the patient or authorized representative who has indicated his/her understanding and acceptance.       Plan Discussed with: CRNA and Anesthesiologist  Anesthesia Plan Comments: (Spinal. GETA as backup plan. )       Anesthesia Quick Evaluation

## 2020-03-03 NOTE — Transfer of Care (Signed)
Immediate Anesthesia Transfer of Care Note  Patient: Whitney Gill  Procedure(s) Performed: RIGHT TOTAL HIP ARTHROPLASTY ANTERIOR APPROACH (Right Hip)  Patient Location: PACU  Anesthesia Type:Spinal  Level of Consciousness: awake and patient cooperative  Airway & Oxygen Therapy: Patient Spontanous Breathing and Patient connected to face mask  Post-op Assessment: Report given to RN and Post -op Vital signs reviewed and stable  Post vital signs: Reviewed and stable  Last Vitals:  Vitals Value Taken Time  BP    Temp    Pulse 83 03/03/20 1246  Resp 20 03/03/20 1247  SpO2 100 % 03/03/20 1246  Vitals shown include unvalidated device data.  Last Pain:  Vitals:   03/03/20 0920  TempSrc:   PainSc: 5       Patients Stated Pain Goal: 4 (03/03/20 0920)  Complications: No complications documented.

## 2020-03-03 NOTE — Anesthesia Postprocedure Evaluation (Signed)
Anesthesia Post Note  Patient: Whitney Gill  Procedure(s) Performed: RIGHT TOTAL HIP ARTHROPLASTY ANTERIOR APPROACH (Right Hip)     Patient location during evaluation: PACU Anesthesia Type: Spinal Level of consciousness: oriented and awake and alert Pain management: pain level controlled Vital Signs Assessment: post-procedure vital signs reviewed and stable Respiratory status: spontaneous breathing and respiratory function stable Cardiovascular status: blood pressure returned to baseline and stable Postop Assessment: no headache, no backache, no apparent nausea or vomiting and patient able to bend at knees Anesthetic complications: no   No complications documented.  Last Vitals:  Vitals:   03/03/20 1345 03/03/20 1400  BP: 126/60 110/79  Pulse: 70 69  Resp: 15 17  Temp: (!) 36.3 C   SpO2: 100% 98%    Last Pain:  Vitals:   03/03/20 1345  TempSrc:   PainSc: 0-No pain                 Mellody Dance

## 2020-03-03 NOTE — Anesthesia Procedure Notes (Signed)
Spinal  Patient location during procedure: OR Start time: 03/03/2020 11:10 AM End time: 03/03/2020 11:15 AM Staffing Performed: anesthesiologist  Anesthesiologist: Mellody Dance, MD Preanesthetic Checklist Completed: patient identified, IV checked, risks and benefits discussed, surgical consent, monitors and equipment checked, pre-op evaluation and timeout performed Spinal Block Patient position: sitting Prep: DuraPrep Patient monitoring: cardiac monitor, continuous pulse ox and blood pressure Approach: midline Location: L3-4 Injection technique: single-shot Needle Needle type: Pencan  Needle gauge: 24 G Needle length: 9 cm Additional Notes Functioning IV was confirmed and monitors were applied. Sterile prep and drape, including hand hygiene and sterile gloves were used. The patient was positioned and the spine was prepped. The skin was anesthetized with lidocaine.  Free flow of clear CSF was obtained prior to injecting local anesthetic into the CSF.  The spinal needle aspirated freely following injection.  The needle was carefully withdrawn.  The patient tolerated the procedure well. 2 attempts by CRNA, 1 attempt by me. + CSF. Tanna Furry, MD

## 2020-03-04 DIAGNOSIS — Z7984 Long term (current) use of oral hypoglycemic drugs: Secondary | ICD-10-CM | POA: Diagnosis not present

## 2020-03-04 DIAGNOSIS — M1611 Unilateral primary osteoarthritis, right hip: Secondary | ICD-10-CM | POA: Diagnosis not present

## 2020-03-04 DIAGNOSIS — Z7982 Long term (current) use of aspirin: Secondary | ICD-10-CM | POA: Diagnosis not present

## 2020-03-04 DIAGNOSIS — E039 Hypothyroidism, unspecified: Secondary | ICD-10-CM | POA: Diagnosis not present

## 2020-03-04 DIAGNOSIS — Z79899 Other long term (current) drug therapy: Secondary | ICD-10-CM | POA: Diagnosis not present

## 2020-03-04 DIAGNOSIS — E119 Type 2 diabetes mellitus without complications: Secondary | ICD-10-CM | POA: Diagnosis not present

## 2020-03-04 DIAGNOSIS — I10 Essential (primary) hypertension: Secondary | ICD-10-CM | POA: Diagnosis not present

## 2020-03-04 LAB — BASIC METABOLIC PANEL
Anion gap: 11 (ref 5–15)
BUN: 12 mg/dL (ref 8–23)
CO2: 24 mmol/L (ref 22–32)
Calcium: 8.6 mg/dL — ABNORMAL LOW (ref 8.9–10.3)
Chloride: 101 mmol/L (ref 98–111)
Creatinine, Ser: 0.74 mg/dL (ref 0.44–1.00)
GFR, Estimated: >60 mL/min (ref >60)
Glucose, Bld: 193 mg/dL — ABNORMAL HIGH (ref 70–99)
Potassium: 4 mmol/L (ref 3.5–5.1)
Sodium: 136 mmol/L (ref 135–145)

## 2020-03-04 LAB — CBC
HCT: 34.4 % — ABNORMAL LOW (ref 36.0–46.0)
Hemoglobin: 11.9 g/dL — ABNORMAL LOW (ref 12.0–15.0)
MCH: 33.6 pg (ref 26.0–34.0)
MCHC: 34.6 g/dL (ref 30.0–36.0)
MCV: 97.2 fL (ref 80.0–100.0)
Platelets: 170 10*3/uL (ref 150–400)
RBC: 3.54 MIL/uL — ABNORMAL LOW (ref 3.87–5.11)
RDW: 12.9 % (ref 11.5–15.5)
WBC: 11.1 10*3/uL — ABNORMAL HIGH (ref 4.0–10.5)
nRBC: 0 % (ref 0.0–0.2)

## 2020-03-04 LAB — GLUCOSE, CAPILLARY
Glucose-Capillary: 176 mg/dL — ABNORMAL HIGH (ref 70–99)
Glucose-Capillary: 146 mg/dL — ABNORMAL HIGH (ref 70–99)
Glucose-Capillary: 177 mg/dL — ABNORMAL HIGH (ref 70–99)
Glucose-Capillary: 170 mg/dL — ABNORMAL HIGH (ref 70–99)

## 2020-03-04 NOTE — TOC Progression Note (Signed)
Transition of Care [facility]) - Progression Note    Patient Details  Name: Whitney Gill MRN: [id number] Date of Birth: 1945/12/02  Transition of Care [facility]) CM/SW Contact  Armanda Heritage, RN Phone Number: 03/04/2020, 9:48 AM  Clinical Narrative:    Patient set up with HHPT with [facility].  Spouse reports patient has rolling walker, 3in1, and transport wheelchair at home.  Expected Discharge Plan: Home w Home Health Services Barriers to Discharge: No Barriers Identified  Expected Discharge Plan and Services Expected Discharge Plan: Home w Home Health Services   Discharge Planning Services: CM Consult Post Acute Care Choice: Home Health Living arrangements for the past 2 months: Single Family Home Expected Discharge Date: 03/04/20               DME Arranged: N/A DME Agency: NA       HH Arranged: PT HH Agency: [facility]     Representative spoke with at [facility] Agency: pre-arranged in md office   Social Determinants of Health (SDOH) Interventions    Readmission Risk Interventions No flowsheet data found.

## 2020-03-04 NOTE — Progress Notes (Signed)
  Subjective: Patient stable.  Pain reasonably well controlled.  Has good family support with her husband.  Has been up in the room to the bathroom today.   Objective: Vital signs in last 24 hours: Temp:  [97.4 F (36.3 C)-98.5 F (36.9 C)] 98.3 F (36.8 C) (01/08 0639) Pulse Rate:  [68-91] 83 (01/08 0639) Resp:  [14-21] 20 (01/08 0639) BP: (88-135)/(49-79) 127/56 (01/08 0639) SpO2:  [88 %-100 %] 99 % (01/08 0639) Weight:  [80 kg] 80 kg (01/07 0920)  Intake/Output from previous day: 01/07 0701 - 01/08 0700 In: 2910.7 [P.O.:730; I.V.:1880.7; IV Piggyback:300] Out: 1650 [Urine:1550; Blood:100] Intake/Output this shift: No intake/output data recorded.  Exam:  Sensation intact distally Intact pulses distally Dorsiflexion/Plantar flexion intact  Labs: Recent Labs    03/04/20 0319  HGB 11.9*   Recent Labs    03/04/20 0319  WBC 11.1*  RBC 3.54*  HCT 34.4*  PLT 170   Recent Labs    03/04/20 0319  NA 136  K 4.0  CL 101  CO2 24  BUN 12  CREATININE 0.74  GLUCOSE 193*  CALCIUM 8.6*   No results for input(s): LABPT, INR in the last 72 hours.  Assessment/Plan: Plan at this time is physical therapy this morning and this afternoon.  Patient would like to have maximum physical therapy before discharge.  If she does okay with therapy I think she is fine to get home.  I have put in her discharge order but if for some reason she is not feeling 100% comfortable about going then her nurse will call me and we can postpone the discharge until tomorrow.   Whitney Gill 03/04/2020, 8:54 AM

## 2020-03-04 NOTE — Progress Notes (Signed)
Physical Therapy Treatment Patient Details Name: Whitney Gill MRN: [id number] DOB: 06/01/1945 Today's Date: 03/04/2020    History of Present Illness Patient is 75 y.o. female s/p Rt THA anterior approach on 03/03/20 with PMH significant for HTN, DM, OA, hypothyroidism.    PT Comments    Pt continues to be limited by pain.  She was educated on transfer techniques, gait, and positioning to assist with pain control.  Educated on exercises but discussed more focus on walking and performing exercises as able due to pain.  Spouse was presented and receptive to education.  He also reports he can get pt in house with wheelchair (as their steps do not have rails) as he has done in the past when she was NWB.   Pt will continue to benefit from therapy prior to return home.    Follow Up Recommendations  Follow surgeon's recommendation for DC plan and follow-up therapies;Home health PT     Equipment Recommendations  None recommended by PT    Recommendations for Other Services       Precautions / Restrictions Precautions Precautions: Fall Restrictions Other Position/Activity Restrictions: WBAT    Mobility  Bed Mobility Overal bed mobility: Needs Assistance Bed Mobility: Supine to Sit     Supine to sit: Min assist;HOB elevated     General bed mobility comments: Increased time; min A for R LE; educated spouse on hand placement for pain control when assisting with transfers, exercises, and positioning  Transfers Overall transfer level: Needs assistance Equipment used: Rolling walker (2 wheeled) Transfers: Sit to/from Stand Sit to Stand: Min guard         General transfer comment: Cues for hand placement and R LE management for pain control; from elevated bed and BSC; pt was able to perform toilteting ADLs  Ambulation/Gait Ambulation/Gait assistance: Min guard Gait Distance (Feet): 100 Feet Assistive device: Rolling walker (2 wheeled) Gait Pattern/deviations: Step-to  pattern;Decreased stride length;Decreased weight shift to right;Trunk flexed;Antalgic Gait velocity: significantly decreased   General Gait Details: Frequent cues for posture, min cues for increased clearance R foot as able   Stairs         General stair comments: Did not perform steps again.  Spoke with spouse who confirmed 2 steps no rails (does have 3 in garage 1 rail but does not typically go that way).  He reports when she broke her ankle a couple years ago, he was able to boost her up steps in transport chair and was comfortable with that.  Plans to do again.   Wheelchair Mobility    Modified Rankin (Stroke Patients Only)       Balance Overall balance assessment: Needs assistance Sitting-balance support: Feet supported Sitting balance-Leahy Scale: Good     Standing balance support: During functional activity;Bilateral upper extremity supported Standing balance-Leahy Scale: Poor Standing balance comment: reliant on RW                            Cognition Arousal/Alertness: Awake/alert Behavior During Therapy: WFL for tasks assessed/performed Overall Cognitive Status: Within Functional Limits for tasks assessed                                        Exercises Total Joint Exercises Ankle Circles/Pumps: AROM;Both;Seated;20 reps Quad Sets: AROM;Both;10 reps;Supine Heel Slides: AAROM;Right;Supine (2 to demonstrate, further held due to pain) Hip ABduction/ADduction:  AAROM;Supine;Right (2 to demonstrate, further held due to pain) Long Arc Quad: AAROM;Supine;Right (2 to demonstrate, further held due to pain)    General Comments General comments (skin integrity, edema, etc.): Educated on HEP, transfer techniques, positioning, and safe ice use.      Pertinent Vitals/Pain Pain Assessment: 0-10 Pain Score:  (7/10 rest; 9/10 acitivity) Pain Location: Rt thigh Pain Descriptors / Indicators: Discomfort;Sore;Sharp Pain Intervention(s): Limited  activity within patient's tolerance;Monitored during session;Premedicated before session;Ice applied    Home Living                      Prior Function            PT Goals (current goals can now be found in the care plan section) Acute Rehab PT Goals Patient Stated Goal: get back to indepedence and sleeping without pain PT Goal Formulation: With patient Time For Goal Achievement: 03/10/20 Potential to Achieve Goals: Good Progress towards PT goals: Progressing toward goals    Frequency    7X/week      PT Plan Current plan remains appropriate    Co-evaluation              AM-PAC PT "6 Clicks" Mobility   Outcome Measure  Help needed turning from your back to your side while in a flat bed without using bedrails?: A Little Help needed moving from lying on your back to sitting on the side of a flat bed without using bedrails?: A Little Help needed moving to and from a bed to a chair (including a wheelchair)?: A Little Help needed standing up from a chair using your arms (e.g., wheelchair or bedside chair)?: A Little Help needed to walk in hospital room?: A Little Help needed climbing 3-5 steps with a railing? : A Little 6 Click Score: 18    End of Session Equipment Utilized During Treatment: Gait belt Activity Tolerance: Patient limited by pain Patient left: in chair;with call bell/phone within reach;with chair alarm set;with family/visitor present Nurse Communication: Mobility status PT Visit Diagnosis: Muscle weakness (generalized) (M62.81);Difficulty in walking, not elsewhere classified (R26.2)     Time: 7001-7494 PT Time Calculation (min) (ACUTE ONLY): 51 min  Charges:  $Gait Training: 8-22 mins $Therapeutic Exercise: 8-22 mins $Therapeutic Activity: 8-22 mins                     Whitney Gill, PT Acute Rehab Services Pager [phone] [facility] [phone]     Whitney Gill 03/04/2020, 4:43 PM

## 2020-03-04 NOTE — Progress Notes (Signed)
Physical Therapy Treatment Patient Details Name: Whitney Gill MRN: [id number] DOB: 1945-08-26 Today's Date: 03/04/2020    History of Present Illness Patient is 75 y.o. female s/p Rt THA anterior approach on 03/03/20 with PMH significant for HTN, DM, OA, hypothyroidism.    PT Comments    Pt was limited due to pain.  She did ambulate 125'.  She performed stairs but with heavy use of rails that she does not have at home.  Pt reporting severe pain particularly with transfers.  Will benefit from further therapy.    Follow Up Recommendations  Follow surgeon's recommendation for DC plan and follow-up therapies;Home health PT     Equipment Recommendations  None recommended by PT    Recommendations for Other Services       Precautions / Restrictions Precautions Precautions: Fall Restrictions Other Position/Activity Restrictions: WBAT    Mobility  Bed Mobility Overal bed mobility: Needs Assistance Bed Mobility: Supine to Sit     Supine to sit: Min assist;HOB elevated     General bed mobility comments: Increased time; min A for R LE  Transfers Overall transfer level: Needs assistance Equipment used: Rolling walker (2 wheeled) Transfers: Sit to/from Stand Sit to Stand: Min assist         General transfer comment: Cues for hand placement and R LE managment for pain control; performed from elevated bed and elevated BSC  Ambulation/Gait Ambulation/Gait assistance: Min guard Gait Distance (Feet): 125 Feet Assistive device: Rolling walker (2 wheeled) Gait Pattern/deviations: Step-to pattern;Decreased stride length;Decreased weight shift to right;Trunk flexed;Antalgic Gait velocity: decr   General Gait Details: Frequent cues for posture, min cues for increased clearance R foot as able   Stairs Stairs: Yes Stairs assistance: Min guard Stair Management: Two rails;Step to pattern;Forwards Number of Stairs: 3 General stair comments: Heavy use of rails; cues for  sequence   Wheelchair Mobility    Modified Rankin (Stroke Patients Only)       Balance Overall balance assessment: Needs assistance Sitting-balance support: Feet supported Sitting balance-Leahy Scale: Good     Standing balance support: During functional activity;Bilateral upper extremity supported Standing balance-Leahy Scale: Poor Standing balance comment: reliant on RW                            Cognition Arousal/Alertness: Awake/alert Behavior During Therapy: WFL for tasks assessed/performed Overall Cognitive Status: Within Functional Limits for tasks assessed                                        Exercises Total Joint Exercises Ankle Circles/Pumps: AROM;Both;Seated;20 reps Quad Sets: AROM;Both;10 reps;Supine (other held due to pain)    General Comments        Pertinent Vitals/Pain Pain Assessment: 0-10 Pain Score: 8  Pain Location: Rt thigh Pain Descriptors / Indicators: Discomfort;Sore Pain Intervention(s): Limited activity within patient's tolerance;Monitored during session;Premedicated before session;Ice applied;Relaxation    Home Living                      Prior Function            PT Goals (current goals can now be found in the care plan section) Acute Rehab PT Goals Patient Stated Goal: get back to indepedence and sleeping without pain PT Goal Formulation: With patient Time For Goal Achievement: 03/10/20 Potential to Achieve Goals: Good Progress  towards PT goals: Progressing toward goals    Frequency    7X/week      PT Plan Current plan remains appropriate    Co-evaluation              AM-PAC PT "6 Clicks" Mobility   Outcome Measure  Help needed turning from your back to your side while in a flat bed without using bedrails?: A Little Help needed moving from lying on your back to sitting on the side of a flat bed without using bedrails?: A Little Help needed moving to and from a bed to a  chair (including a wheelchair)?: A Little Help needed standing up from a chair using your arms (e.g., wheelchair or bedside chair)?: A Little Help needed to walk in hospital room?: A Little Help needed climbing 3-5 steps with a railing? : A Little 6 Click Score: 18    End of Session Equipment Utilized During Treatment: Gait belt Activity Tolerance: Patient limited by pain Patient left: in chair;with call bell/phone within reach;with chair alarm set;with family/visitor present Nurse Communication: Mobility status PT Visit Diagnosis: Muscle weakness (generalized) (M62.81);Difficulty in walking, not elsewhere classified (R26.2)     Time: 9163-8466 PT Time Calculation (min) (ACUTE ONLY): 39 min  Charges:  $Gait Training: 8-22 mins $Therapeutic Exercise: 8-22 mins $Therapeutic Activity: 8-22 mins                     Anise Salvo, PT Acute Rehab Services Pager [phone] [facility] [phone]     Whitney Gill 03/04/2020, 10:36 AM

## 2020-03-04 NOTE — Plan of Care (Signed)

## 2020-03-05 DIAGNOSIS — Z79899 Other long term (current) drug therapy: Secondary | ICD-10-CM | POA: Diagnosis not present

## 2020-03-05 DIAGNOSIS — Z7984 Long term (current) use of oral hypoglycemic drugs: Secondary | ICD-10-CM | POA: Diagnosis not present

## 2020-03-05 DIAGNOSIS — E119 Type 2 diabetes mellitus without complications: Secondary | ICD-10-CM | POA: Diagnosis not present

## 2020-03-05 DIAGNOSIS — M1611 Unilateral primary osteoarthritis, right hip: Secondary | ICD-10-CM | POA: Diagnosis not present

## 2020-03-05 DIAGNOSIS — Z7982 Long term (current) use of aspirin: Secondary | ICD-10-CM | POA: Diagnosis not present

## 2020-03-05 DIAGNOSIS — I10 Essential (primary) hypertension: Secondary | ICD-10-CM | POA: Diagnosis not present

## 2020-03-05 DIAGNOSIS — E039 Hypothyroidism, unspecified: Secondary | ICD-10-CM | POA: Diagnosis not present

## 2020-03-05 LAB — GLUCOSE, CAPILLARY
Glucose-Capillary: 154 mg/dL — ABNORMAL HIGH (ref 70–99)
Glucose-Capillary: 135 mg/dL — ABNORMAL HIGH (ref 70–99)
Glucose-Capillary: 200 mg/dL — ABNORMAL HIGH (ref 70–99)

## 2020-03-05 LAB — CBC
HCT: 32.2 % — ABNORMAL LOW (ref 36.0–46.0)
Hemoglobin: 11.1 g/dL — ABNORMAL LOW (ref 12.0–15.0)
MCH: 34 pg (ref 26.0–34.0)
MCHC: 34.5 g/dL (ref 30.0–36.0)
MCV: 98.8 fL (ref 80.0–100.0)
Platelets: 156 10*3/uL (ref 150–400)
RBC: 3.26 MIL/uL — ABNORMAL LOW (ref 3.87–5.11)
RDW: 13 % (ref 11.5–15.5)
WBC: 10.4 10*3/uL (ref 4.0–10.5)
nRBC: 0 % (ref 0.0–0.2)

## 2020-03-05 NOTE — Progress Notes (Signed)
Patient states she is feeling better and wants to go home. Discharge instructions reviewed with her and her husband Rhenda [place]). Patient being discharged to home.

## 2020-03-05 NOTE — Progress Notes (Signed)
Patient complaining of nausea, antiemetic given. Patient vomited 75 ml of undigested food.

## 2020-03-05 NOTE — Progress Notes (Signed)
  Subjective: Whitney Gill is a 75 y.o. female s/p right THA.  They are POD 2.  Pt's pain is controlled and improved compared with yesterday.  Pt has ambulated with some difficulty.  Progressing well with physical therapy.  Denies any dizziness or lightheadedness when ambulating.  Lives at home with her husband.  Objective: Vital signs in last 24 hours: Temp:  [97.9 F (36.6 C)-99.9 F (37.7 C)] 98.2 F (36.8 C) (01/09 0342) Pulse Rate:  [89-104] 90 (01/09 0342) Resp:  [18-20] 20 (01/09 0342) BP: (102-134)/(53-62) 102/58 (01/09 0342) SpO2:  [92 %-96 %] 92 % (01/09 0342)  Intake/Output from previous day: 01/08 0701 - 01/09 0700 In: 900 [P.O.:900] Out: 725 [Urine:725] Intake/Output this shift: Total I/O In: 120 [P.O.:120] Out: 300 [Urine:300]  Exam:  No gross blood or drainage overlying the dressing Right foot warm and well-perfused Sensation intact distally in the right foot Able to dorsiflex and plantarflex the right foot   Labs: Recent Labs    03/04/20 0319 03/05/20 0323  HGB 11.9* 11.1*   Recent Labs    03/04/20 0319 03/05/20 0323  WBC 11.1* 10.4  RBC 3.54* 3.26*  HCT 34.4* 32.2*  PLT 170 156   Recent Labs    03/04/20 0319  NA 136  K 4.0  CL 101  CO2 24  BUN 12  CREATININE 0.74  GLUCOSE 193*  CALCIUM 8.6*   No results for input(s): LABPT, INR in the last 72 hours.  Assessment/Plan: Pt is POD 2 s/p right THA.    -Plan to discharge to home today  -WBAT with a walker   Petros Ahart L Jairen Goldfarb 03/05/2020, 11:14 AM

## 2020-03-05 NOTE — Progress Notes (Signed)
   03/05/20 1500  PT Visit Information  Last PT Received On 03/05/20  Assistance Needed +1  History of Present Illness Patient is 75 y.o. female s/p Rt THA anterior approach on 03/03/20 with PMH significant for HTN, DM, OA, hypothyroidism.   Pt continues to progress. Slightly incr gait speed. No LOB. Husband present for session. He placed a temporary ramp so they can use transport chair to get into the house. [place] for d/c with husband assist    Subjective Data  Patient Stated Goal get back to indepedence and sleeping without pain  Precautions  Precautions Fall  Restrictions  Other Position/Activity Restrictions WBAT  Pain Assessment  Pain Assessment 0-10  Pain Score 4  Pain Location Rt thigh  Pain Descriptors / Indicators Discomfort;Sore;Sharp  Pain Intervention(s) Limited activity within patient's tolerance;Monitored during session;Premedicated before session;Ice applied  Cognition  Arousal/Alertness Awake/alert  Behavior During Therapy WFL for tasks assessed/performed  Overall Cognitive Status Within Functional Limits for tasks assessed  Bed Mobility  General bed mobility comments in recliner  Transfers  Overall transfer level Needs assistance  Equipment used Rolling walker (2 wheeled)  Transfers Sit to/from Stand  Sit to Stand Min guard  General transfer comment Cues for hand placement and R LE management for pain control; incr time but able to perform without physical assist  Ambulation/Gait  Ambulation/Gait assistance Min guard;Supervision  Gait Distance (Feet) 30 Feet  Assistive device Rolling walker (2 wheeled)  Gait Pattern/deviations Step-to pattern;Decreased step length - right;Decreased step length - left;Trunk flexed  General Gait Details cues for posture, RW position. min/guard to supervision for safety. mild nausea limiting distance  Total Joint Exercises  Ankle Circles/Pumps AROM;Both;10 reps  Heel Slides AAROM;Right;10 reps  PT - End of Session  Equipment  Utilized During Treatment Gait belt  Activity Tolerance Patient tolerated treatment well  Patient left in chair;with call bell/phone within reach;with chair alarm set;with family/visitor present  Nurse Communication Mobility status   PT - Assessment/Plan  PT Plan Current plan remains appropriate  PT Visit Diagnosis Muscle weakness (generalized) (M62.81);Difficulty in walking, not elsewhere classified (R26.2)  PT Frequency (ACUTE ONLY) 7X/week  Follow Up Recommendations Follow surgeon's recommendation for DC plan and follow-up therapies;Home health PT  PT equipment None recommended by PT  AM-PAC PT "6 Clicks" Mobility Outcome Measure (Version 2)  Help needed turning from your back to your side while in a flat bed without using bedrails? 3  Help needed moving from lying on your back to sitting on the side of a flat bed without using bedrails? 3  Help needed moving to and from a bed to a chair (including a wheelchair)? 3  Help needed standing up from a chair using your arms (e.g., wheelchair or bedside chair)? 3  Help needed to walk in hospital room? 3  Help needed climbing 3-5 steps with a railing?  3  6 Click Score 18  Consider Recommendation of Discharge To: Home with [facility]  PT Goal Progression  Progress towards PT goals Progressing toward goals  Acute Rehab PT Goals  PT Goal Formulation With patient  Time For Goal Achievement 03/10/20  Potential to Achieve Goals Good  PT Time Calculation  PT Start Time (ACUTE ONLY) 1355  PT Stop Time (ACUTE ONLY) 1410  PT Time Calculation (min) (ACUTE ONLY) 15 min  PT General Charges  $$ ACUTE PT VISIT 1 Visit  PT Treatments  $Gait Training 8-22 mins
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
## 2020-03-06 ENCOUNTER — Encounter (HOSPITAL_COMMUNITY): Payer: Self-pay | Admitting: Orthopaedic Surgery

## 2020-03-06 DIAGNOSIS — E119 Type 2 diabetes mellitus without complications: Secondary | ICD-10-CM | POA: Diagnosis not present

## 2020-03-06 DIAGNOSIS — Z7984 Long term (current) use of oral hypoglycemic drugs: Secondary | ICD-10-CM | POA: Diagnosis not present

## 2020-03-06 DIAGNOSIS — Z96641 Presence of right artificial hip joint: Secondary | ICD-10-CM | POA: Diagnosis not present

## 2020-03-06 DIAGNOSIS — M1710 Unilateral primary osteoarthritis, unspecified knee: Secondary | ICD-10-CM | POA: Diagnosis not present

## 2020-03-06 DIAGNOSIS — E039 Hypothyroidism, unspecified: Secondary | ICD-10-CM | POA: Diagnosis not present

## 2020-03-06 DIAGNOSIS — Z471 Aftercare following joint replacement surgery: Secondary | ICD-10-CM | POA: Diagnosis not present

## 2020-03-06 DIAGNOSIS — K219 Gastro-esophageal reflux disease without esophagitis: Secondary | ICD-10-CM | POA: Diagnosis not present

## 2020-03-06 DIAGNOSIS — I10 Essential (primary) hypertension: Secondary | ICD-10-CM | POA: Diagnosis not present

## 2020-03-06 DIAGNOSIS — Z7982 Long term (current) use of aspirin: Secondary | ICD-10-CM | POA: Diagnosis not present

## 2020-03-06 NOTE — Discharge Summary (Signed)
Patient ID: Whitney Gill MRN: [id number] DOB/AGE: 06-03-1945 75 y.o.  Admit date: 03/03/2020 Discharge date: 03/06/2020  Admission Diagnoses:  Principal Problem:   Unilateral primary osteoarthritis, right hip Active Problems:   Status post total hip replacement, right   Discharge Diagnoses:  Same  Past Medical History:  Diagnosis Date  . Arthritis    hip, knee  . Diabetes mellitus without complication (HCC)   . Hypertension   . Hypothyroidism     Surgeries: Procedure(s): RIGHT TOTAL HIP ARTHROPLASTY ANTERIOR APPROACH on 03/03/2020   Consultants:   Discharged Condition: Improved  Hospital Course: CAMARIE MCTIGUE is an 75 y.o. female who was admitted 03/03/2020 for operative treatment ofUnilateral primary osteoarthritis, right hip. Patient has severe unremitting pain that affects sleep, daily activities, and work/hobbies. After pre-op clearance the patient was taken to the operating room on 03/03/2020 and underwent  Procedure(s): RIGHT TOTAL HIP ARTHROPLASTY ANTERIOR APPROACH.    Patient was given perioperative antibiotics:  Anti-infectives (From admission, onward)   Start     Dose/Rate Route Frequency Ordered Stop   03/03/20 1700  ceFAZolin (ANCEF) IVPB 1 g/50 mL premix        1 g 100 mL/hr over 30 Minutes Intravenous Every 6 hours 03/03/20 1614 03/03/20 2231   03/03/20 0900  ceFAZolin (ANCEF) IVPB 2g/100 mL premix        2 g 200 mL/hr over 30 Minutes Intravenous On call to O.R. 03/03/20 0850 03/03/20 1125       Patient was given sequential compression devices, early ambulation, and chemoprophylaxis to prevent DVT.  Patient benefited maximally from hospital stay and there were no complications.    Recent vital signs: No data found.   Recent laboratory studies:  Recent Labs    03/04/20 0319 03/05/20 0323  WBC 11.1* 10.4  HGB 11.9* 11.1*  HCT 34.4* 32.2*  PLT 170 156  NA 136  --   K 4.0  --   CL 101  --   CO2 24  --   BUN 12  --   CREATININE 0.74  --    GLUCOSE 193*  --   CALCIUM 8.6*  --      Discharge Medications:   Allergies as of 03/05/2020      Reactions   Prednisone Palpitations   Heart racing & turns red      Medication List    STOP taking these medications   aspirin EC 81 MG tablet Replaced by: aspirin 81 MG chewable tablet     TAKE these medications   acetaminophen 650 MG CR tablet Commonly known as: TYLENOL Take 650-1,300 mg by mouth every 8 (eight) hours as needed for pain. Notes to patient: 03/05/2020 @ 5:06 pm    albuterol 108 (90 Base) MCG/ACT inhaler Commonly known as: VENTOLIN HFA Inhale 2 puffs into the lungs 4 (four) times daily as needed (whezzing /  sob). As needed. Notes to patient: 03/05/2020    Align 4 MG Caps Take 4 mg by mouth daily. Notes to patient: 03/06/2020 @ 10:00 am    aspirin 81 MG chewable tablet Chew 1 tablet (81 mg total) by mouth 2 (two) times daily. Replaces: aspirin EC 81 MG tablet Notes to patient: 03/05/2020 @ 10:00 pm    atorvastatin 20 MG tablet Commonly known as: LIPITOR Take 20 mg by mouth every Monday, Wednesday, and Friday. Notes to patient: 03/06/2020 @ 10:00 am    Biofreeze 4 % Gel Generic drug: Menthol (Topical Analgesic) Apply 1 application topically daily as  needed (pain). Notes to patient: 03/05/2020   CALTRATE 600+D3 PO Take 1 tablet by mouth daily. Notes to patient: 03/06/2020 @ 10:00 am    CoQ10 200 MG Caps Take 200 mg by mouth at bedtime. Notes to patient: 03/05/2020 @ 10:00 pm    fluticasone 50 MCG/ACT nasal spray Commonly known as: FLONASE Place 2 sprays into both nostrils daily as needed (allergies.). Notes to patient: 03/05/2020    hydrochlorothiazide 12.5 MG capsule Commonly known as: MICROZIDE Take 12.5 mg by mouth daily. Notes to patient: 03/06/2020 @ 10:00 am    ipratropium 0.06 % nasal spray Commonly known as: ATROVENT Place 2 sprays into the nose 4 (four) times daily as needed. Notes to patient: 03/05/2020    levothyroxine 75 MCG  tablet Commonly known as: SYNTHROID Take 75 mcg by mouth daily before breakfast. Notes to patient: 03/06/2020 @ 6:00 am    losartan 25 MG tablet Commonly known as: COZAAR Take 25 mg by mouth daily. Notes to patient: 03/06/2020 @ 10:00 am    Lubricant Eye Drops 0.4-0.3 % Soln Generic drug: Polyethyl Glycol-Propyl Glycol Place 1 drop into both eyes 4 (four) times daily as needed (dry/irritated eyes.). Notes to patient: 03/05/2020    metFORMIN 500 MG 24 hr tablet Commonly known as: GLUCOPHAGE-XR Take 500 mg by mouth 2 (two) times daily. Notes to patient: 03/05/2020 @ 5:00 pm    methocarbamol 500 MG tablet Commonly known as: ROBAXIN Take 1 tablet (500 mg total) by mouth every 6 (six) hours as needed for muscle spasms. Notes to patient: 03/05/2020 @ 3:06 pm    multivitamin with minerals Tabs tablet Take 1 tablet by mouth daily. Centrum Silver Notes to patient: 03/06/2020 @ 10:00 am    OVER THE COUNTER MEDICATION Apply 1 application topically daily as needed (pain). CBD Salve   oxyCODONE 5 MG immediate release tablet Commonly known as: Oxy IR/ROXICODONE Take 1-2 tablets (5-10 mg total) by mouth every 4 (four) hours as needed for moderate pain (pain score 4-6). Notes to patient: 03/05/2021 4:35 pm    pantoprazole 20 MG tablet Commonly known as: PROTONIX Take 20 mg by mouth at bedtime. Notes to patient: 03/05/2020 @ 5:00 pm    Turmeric 500 MG Caps Take 500 mg by mouth at bedtime. Notes to patient: 03/06/2019 @ 10:00 pm    Vitamin D (Cholecalciferol) 25 MCG (1000 UT) Caps Take 1,000 Units by mouth in the morning and at bedtime. Notes to patient: 03/05/2020 @ 10:00 pm        Diagnostic Studies: DG Pelvis Portable  Result Date: 03/03/2020 CLINICAL DATA:  Right hip replacement surgery EXAM: PORTABLE PELVIS 1-2 VIEWS COMPARISON:  12/09/2019 FINDINGS: Right hip arthroplasty components project in expected location. No fracture or dislocation. Osteitis pubis. IMPRESSION: Right hip  arthroplasty without apparent complication. Electronically Signed   By: Corlis Leak M.D.   On: 03/03/2020 13:49   DG C-Arm 1-60 Min-No Report  Result Date: 03/03/2020 Fluoroscopy was utilized by the requesting physician.  No radiographic interpretation.   DG HIP OPERATIVE UNILAT W OR W/O PELVIS RIGHT  Result Date: 03/03/2020 CLINICAL DATA:  Intraoperative during RIGHT hip replacement EXAM: OPERATIVE RIGHT HIP (WITH PELVIS IF PERFORMED) 3 VIEWS TECHNIQUE: Fluoroscopic spot image(s) were submitted for interpretation post-operatively. COMPARISON:  12/09/2019 FLUOROSCOPY TIME:  0 minutes 17 seconds Dose: 2.27 mGy FINDINGS: RIGHT hip prosthesis identified in expected position. No fracture, dislocation, or bone destruction. IMPRESSION: RIGHT hip prosthesis without acute complication. Electronically Signed   By: Angelyn Punt.D.  On: 03/03/2020 12:59    Disposition: Discharge disposition: 01-Home or Self Care       Discharge Instructions    Call MD / Call 911   Complete by: As directed    If you experience chest pain or shortness of breath, CALL 911 and be transported to the hospital emergency room.  If you develope a fever above 101 F, pus (white drainage) or increased drainage or redness at the wound, or calf pain, call your surgeon's office.   Constipation Prevention   Complete by: As directed    Drink plenty of fluids.  Prune juice may be helpful.  You may use a stool softener, such as Colace (over the counter) 100 mg twice a day.  Use MiraLax (over the counter) for constipation as needed.   Diet - low sodium heart healthy   Complete by: As directed    Increase activity slowly as tolerated   Complete by: As directed        Follow-up Information    Kathryne Hitch, MD Follow up in 2 week(s).   Specialty: Orthopedic Surgery Contact information: [address] [place] [address] [phone]        [facility] Follow up.   Specialty: Home Health Services Why:  agency will provide home health physical therapy. Contact information: [address] [place] [address] [phone]                Signed: Kathryne Hitch 03/06/2020, 3:27 PM

## 2020-03-09 DIAGNOSIS — Z7984 Long term (current) use of oral hypoglycemic drugs: Secondary | ICD-10-CM | POA: Diagnosis not present

## 2020-03-09 DIAGNOSIS — K219 Gastro-esophageal reflux disease without esophagitis: Secondary | ICD-10-CM | POA: Diagnosis not present

## 2020-03-09 DIAGNOSIS — Z471 Aftercare following joint replacement surgery: Secondary | ICD-10-CM | POA: Diagnosis not present

## 2020-03-09 DIAGNOSIS — E039 Hypothyroidism, unspecified: Secondary | ICD-10-CM | POA: Diagnosis not present

## 2020-03-09 DIAGNOSIS — I10 Essential (primary) hypertension: Secondary | ICD-10-CM | POA: Diagnosis not present

## 2020-03-09 DIAGNOSIS — M1710 Unilateral primary osteoarthritis, unspecified knee: Secondary | ICD-10-CM | POA: Diagnosis not present

## 2020-03-09 DIAGNOSIS — Z7982 Long term (current) use of aspirin: Secondary | ICD-10-CM | POA: Diagnosis not present

## 2020-03-09 DIAGNOSIS — E119 Type 2 diabetes mellitus without complications: Secondary | ICD-10-CM | POA: Diagnosis not present

## 2020-03-09 DIAGNOSIS — Z96641 Presence of right artificial hip joint: Secondary | ICD-10-CM | POA: Diagnosis not present

## 2020-03-14 DIAGNOSIS — Z471 Aftercare following joint replacement surgery: Secondary | ICD-10-CM | POA: Diagnosis not present

## 2020-03-14 DIAGNOSIS — Z96641 Presence of right artificial hip joint: Secondary | ICD-10-CM | POA: Diagnosis not present

## 2020-03-14 DIAGNOSIS — E039 Hypothyroidism, unspecified: Secondary | ICD-10-CM | POA: Diagnosis not present

## 2020-03-14 DIAGNOSIS — E119 Type 2 diabetes mellitus without complications: Secondary | ICD-10-CM | POA: Diagnosis not present

## 2020-03-14 DIAGNOSIS — Z7982 Long term (current) use of aspirin: Secondary | ICD-10-CM | POA: Diagnosis not present

## 2020-03-14 DIAGNOSIS — I10 Essential (primary) hypertension: Secondary | ICD-10-CM | POA: Diagnosis not present

## 2020-03-14 DIAGNOSIS — M1710 Unilateral primary osteoarthritis, unspecified knee: Secondary | ICD-10-CM | POA: Diagnosis not present

## 2020-03-14 DIAGNOSIS — Z7984 Long term (current) use of oral hypoglycemic drugs: Secondary | ICD-10-CM | POA: Diagnosis not present

## 2020-03-14 DIAGNOSIS — K219 Gastro-esophageal reflux disease without esophagitis: Secondary | ICD-10-CM | POA: Diagnosis not present

## 2020-03-16 ENCOUNTER — Ambulatory Visit (INDEPENDENT_AMBULATORY_CARE_PROVIDER_SITE_OTHER): Payer: Medicare PPO | Admitting: Orthopaedic Surgery

## 2020-03-16 ENCOUNTER — Encounter: Payer: Self-pay | Admitting: Orthopaedic Surgery

## 2020-03-16 DIAGNOSIS — Z96641 Presence of right artificial hip joint: Secondary | ICD-10-CM | POA: Diagnosis not present

## 2020-03-16 DIAGNOSIS — M1710 Unilateral primary osteoarthritis, unspecified knee: Secondary | ICD-10-CM | POA: Diagnosis not present

## 2020-03-16 DIAGNOSIS — E039 Hypothyroidism, unspecified: Secondary | ICD-10-CM | POA: Diagnosis not present

## 2020-03-16 DIAGNOSIS — Z7982 Long term (current) use of aspirin: Secondary | ICD-10-CM | POA: Diagnosis not present

## 2020-03-16 DIAGNOSIS — Z471 Aftercare following joint replacement surgery: Secondary | ICD-10-CM | POA: Diagnosis not present

## 2020-03-16 DIAGNOSIS — Z7984 Long term (current) use of oral hypoglycemic drugs: Secondary | ICD-10-CM | POA: Diagnosis not present

## 2020-03-16 DIAGNOSIS — I10 Essential (primary) hypertension: Secondary | ICD-10-CM | POA: Diagnosis not present

## 2020-03-16 DIAGNOSIS — K219 Gastro-esophageal reflux disease without esophagitis: Secondary | ICD-10-CM | POA: Diagnosis not present

## 2020-03-16 DIAGNOSIS — E119 Type 2 diabetes mellitus without complications: Secondary | ICD-10-CM | POA: Diagnosis not present

## 2020-03-16 NOTE — Progress Notes (Signed)
The patient is doing well status post a total hip arthroplasty we did on the right hip 2 weeks ago.  She is ambulate with a walker and has used a cane some.  She is 75 years old.  She did have a lot of pain last evening.  She does report increase strength and range of motion.  She is having difficulty sleep.  She has been on a baby aspirin twice a day.  She was on once a day before surgery.  She can go back to once a day.  The right hip incision looks good.  The staples been removed and Steri-Strips applied.  There is no significant seroma.  Her leg lengths are near equal.  She will continue to increase her activities as comfort allows.  I would like to see her back in 4 weeks to make sure she is doing well.  She can try few sessions of outpatient therapy if she would like.

## 2020-03-21 DIAGNOSIS — E039 Hypothyroidism, unspecified: Secondary | ICD-10-CM | POA: Diagnosis not present

## 2020-03-21 DIAGNOSIS — E119 Type 2 diabetes mellitus without complications: Secondary | ICD-10-CM | POA: Diagnosis not present

## 2020-03-21 DIAGNOSIS — Z7984 Long term (current) use of oral hypoglycemic drugs: Secondary | ICD-10-CM | POA: Diagnosis not present

## 2020-03-21 DIAGNOSIS — M1710 Unilateral primary osteoarthritis, unspecified knee: Secondary | ICD-10-CM | POA: Diagnosis not present

## 2020-03-21 DIAGNOSIS — Z471 Aftercare following joint replacement surgery: Secondary | ICD-10-CM | POA: Diagnosis not present

## 2020-03-21 DIAGNOSIS — Z7982 Long term (current) use of aspirin: Secondary | ICD-10-CM | POA: Diagnosis not present

## 2020-03-21 DIAGNOSIS — Z96641 Presence of right artificial hip joint: Secondary | ICD-10-CM | POA: Diagnosis not present

## 2020-03-21 DIAGNOSIS — I10 Essential (primary) hypertension: Secondary | ICD-10-CM | POA: Diagnosis not present

## 2020-03-21 DIAGNOSIS — K219 Gastro-esophageal reflux disease without esophagitis: Secondary | ICD-10-CM | POA: Diagnosis not present

## 2020-03-23 DIAGNOSIS — E119 Type 2 diabetes mellitus without complications: Secondary | ICD-10-CM | POA: Diagnosis not present

## 2020-03-23 DIAGNOSIS — E039 Hypothyroidism, unspecified: Secondary | ICD-10-CM | POA: Diagnosis not present

## 2020-03-23 DIAGNOSIS — Z96641 Presence of right artificial hip joint: Secondary | ICD-10-CM | POA: Diagnosis not present

## 2020-03-23 DIAGNOSIS — K219 Gastro-esophageal reflux disease without esophagitis: Secondary | ICD-10-CM | POA: Diagnosis not present

## 2020-03-23 DIAGNOSIS — I10 Essential (primary) hypertension: Secondary | ICD-10-CM | POA: Diagnosis not present

## 2020-03-23 DIAGNOSIS — Z7984 Long term (current) use of oral hypoglycemic drugs: Secondary | ICD-10-CM | POA: Diagnosis not present

## 2020-03-23 DIAGNOSIS — M1710 Unilateral primary osteoarthritis, unspecified knee: Secondary | ICD-10-CM | POA: Diagnosis not present

## 2020-03-23 DIAGNOSIS — Z471 Aftercare following joint replacement surgery: Secondary | ICD-10-CM | POA: Diagnosis not present

## 2020-03-23 DIAGNOSIS — Z7982 Long term (current) use of aspirin: Secondary | ICD-10-CM | POA: Diagnosis not present

## 2020-04-10 DIAGNOSIS — R2689 Other abnormalities of gait and mobility: Secondary | ICD-10-CM | POA: Diagnosis not present

## 2020-04-10 DIAGNOSIS — M25651 Stiffness of right hip, not elsewhere classified: Secondary | ICD-10-CM | POA: Diagnosis not present

## 2020-04-10 DIAGNOSIS — Z471 Aftercare following joint replacement surgery: Secondary | ICD-10-CM | POA: Diagnosis not present

## 2020-04-10 DIAGNOSIS — M1611 Unilateral primary osteoarthritis, right hip: Secondary | ICD-10-CM | POA: Diagnosis not present

## 2020-04-10 DIAGNOSIS — M6281 Muscle weakness (generalized): Secondary | ICD-10-CM | POA: Diagnosis not present

## 2020-04-10 DIAGNOSIS — Z96641 Presence of right artificial hip joint: Secondary | ICD-10-CM | POA: Diagnosis not present

## 2020-04-11 DIAGNOSIS — L84 Corns and callosities: Secondary | ICD-10-CM | POA: Diagnosis not present

## 2020-04-11 DIAGNOSIS — B351 Tinea unguium: Secondary | ICD-10-CM | POA: Diagnosis not present

## 2020-04-11 DIAGNOSIS — E1142 Type 2 diabetes mellitus with diabetic polyneuropathy: Secondary | ICD-10-CM | POA: Diagnosis not present

## 2020-04-11 DIAGNOSIS — M79676 Pain in unspecified toe(s): Secondary | ICD-10-CM | POA: Diagnosis not present

## 2020-04-12 DIAGNOSIS — M6281 Muscle weakness (generalized): Secondary | ICD-10-CM | POA: Diagnosis not present

## 2020-04-12 DIAGNOSIS — R2689 Other abnormalities of gait and mobility: Secondary | ICD-10-CM | POA: Diagnosis not present

## 2020-04-12 DIAGNOSIS — M25651 Stiffness of right hip, not elsewhere classified: Secondary | ICD-10-CM | POA: Diagnosis not present

## 2020-04-12 DIAGNOSIS — Z471 Aftercare following joint replacement surgery: Secondary | ICD-10-CM | POA: Diagnosis not present

## 2020-04-12 DIAGNOSIS — M1611 Unilateral primary osteoarthritis, right hip: Secondary | ICD-10-CM | POA: Diagnosis not present

## 2020-04-12 DIAGNOSIS — Z96641 Presence of right artificial hip joint: Secondary | ICD-10-CM | POA: Diagnosis not present

## 2020-04-13 ENCOUNTER — Ambulatory Visit (INDEPENDENT_AMBULATORY_CARE_PROVIDER_SITE_OTHER): Payer: Medicare PPO | Admitting: Orthopaedic Surgery

## 2020-04-13 ENCOUNTER — Encounter: Payer: Self-pay | Admitting: Orthopaedic Surgery

## 2020-04-13 DIAGNOSIS — Z96641 Presence of right artificial hip joint: Secondary | ICD-10-CM

## 2020-04-13 NOTE — Progress Notes (Signed)
The patient is now 6 weeks status post a right total hip arthroplasty.  She is doing well overall.  She is ambulate with a cane but does not use it in the house.  She still having some groin pain.  On exam her hip moves smoothly and fluidly on the right side.  I did look at her incision and she looks good from a scar standpoint.  I gave her reassurance that she can try any scar creams and lotions on this.  She will continue increase her activities as comfort allows.  She is going to continue outpatient therapy for 4 more weeks which I think is reasonable.  From my standpoint, I do not need to see her back for 6 months unless she is having issues.  At that visit like a standing low AP pelvis and lateral of her right operative hip.  All questions and concerns were answered and addressed.

## 2020-04-17 DIAGNOSIS — M25651 Stiffness of right hip, not elsewhere classified: Secondary | ICD-10-CM | POA: Diagnosis not present

## 2020-04-17 DIAGNOSIS — Z96641 Presence of right artificial hip joint: Secondary | ICD-10-CM | POA: Diagnosis not present

## 2020-04-17 DIAGNOSIS — Z471 Aftercare following joint replacement surgery: Secondary | ICD-10-CM | POA: Diagnosis not present

## 2020-04-17 DIAGNOSIS — M1611 Unilateral primary osteoarthritis, right hip: Secondary | ICD-10-CM | POA: Diagnosis not present

## 2020-04-17 DIAGNOSIS — M6281 Muscle weakness (generalized): Secondary | ICD-10-CM | POA: Diagnosis not present

## 2020-04-17 DIAGNOSIS — R2689 Other abnormalities of gait and mobility: Secondary | ICD-10-CM | POA: Diagnosis not present

## 2020-04-20 DIAGNOSIS — Z471 Aftercare following joint replacement surgery: Secondary | ICD-10-CM | POA: Diagnosis not present

## 2020-04-20 DIAGNOSIS — Z96641 Presence of right artificial hip joint: Secondary | ICD-10-CM | POA: Diagnosis not present

## 2020-04-20 DIAGNOSIS — M25651 Stiffness of right hip, not elsewhere classified: Secondary | ICD-10-CM | POA: Diagnosis not present

## 2020-04-20 DIAGNOSIS — M6281 Muscle weakness (generalized): Secondary | ICD-10-CM | POA: Diagnosis not present

## 2020-04-20 DIAGNOSIS — M1611 Unilateral primary osteoarthritis, right hip: Secondary | ICD-10-CM | POA: Diagnosis not present

## 2020-04-20 DIAGNOSIS — R2689 Other abnormalities of gait and mobility: Secondary | ICD-10-CM | POA: Diagnosis not present

## 2020-04-24 DIAGNOSIS — Z471 Aftercare following joint replacement surgery: Secondary | ICD-10-CM | POA: Diagnosis not present

## 2020-04-24 DIAGNOSIS — M6281 Muscle weakness (generalized): Secondary | ICD-10-CM | POA: Diagnosis not present

## 2020-04-24 DIAGNOSIS — M1611 Unilateral primary osteoarthritis, right hip: Secondary | ICD-10-CM | POA: Diagnosis not present

## 2020-04-24 DIAGNOSIS — M25651 Stiffness of right hip, not elsewhere classified: Secondary | ICD-10-CM | POA: Diagnosis not present

## 2020-04-24 DIAGNOSIS — R2689 Other abnormalities of gait and mobility: Secondary | ICD-10-CM | POA: Diagnosis not present

## 2020-04-24 DIAGNOSIS — Z96641 Presence of right artificial hip joint: Secondary | ICD-10-CM | POA: Diagnosis not present

## 2020-04-27 DIAGNOSIS — M1611 Unilateral primary osteoarthritis, right hip: Secondary | ICD-10-CM | POA: Diagnosis not present

## 2020-04-27 DIAGNOSIS — Z96641 Presence of right artificial hip joint: Secondary | ICD-10-CM | POA: Diagnosis not present

## 2020-04-27 DIAGNOSIS — M6281 Muscle weakness (generalized): Secondary | ICD-10-CM | POA: Diagnosis not present

## 2020-04-27 DIAGNOSIS — Z471 Aftercare following joint replacement surgery: Secondary | ICD-10-CM | POA: Diagnosis not present

## 2020-04-27 DIAGNOSIS — M25651 Stiffness of right hip, not elsewhere classified: Secondary | ICD-10-CM | POA: Diagnosis not present

## 2020-04-27 DIAGNOSIS — R2689 Other abnormalities of gait and mobility: Secondary | ICD-10-CM | POA: Diagnosis not present

## 2020-05-01 DIAGNOSIS — R2689 Other abnormalities of gait and mobility: Secondary | ICD-10-CM | POA: Diagnosis not present

## 2020-05-01 DIAGNOSIS — M6281 Muscle weakness (generalized): Secondary | ICD-10-CM | POA: Diagnosis not present

## 2020-05-01 DIAGNOSIS — M1611 Unilateral primary osteoarthritis, right hip: Secondary | ICD-10-CM | POA: Diagnosis not present

## 2020-05-01 DIAGNOSIS — Z96641 Presence of right artificial hip joint: Secondary | ICD-10-CM | POA: Diagnosis not present

## 2020-05-01 DIAGNOSIS — M25651 Stiffness of right hip, not elsewhere classified: Secondary | ICD-10-CM | POA: Diagnosis not present

## 2020-05-01 DIAGNOSIS — Z471 Aftercare following joint replacement surgery: Secondary | ICD-10-CM | POA: Diagnosis not present

## 2020-05-04 DIAGNOSIS — Z471 Aftercare following joint replacement surgery: Secondary | ICD-10-CM | POA: Diagnosis not present

## 2020-05-04 DIAGNOSIS — M1611 Unilateral primary osteoarthritis, right hip: Secondary | ICD-10-CM | POA: Diagnosis not present

## 2020-05-04 DIAGNOSIS — R2689 Other abnormalities of gait and mobility: Secondary | ICD-10-CM | POA: Diagnosis not present

## 2020-05-04 DIAGNOSIS — M25651 Stiffness of right hip, not elsewhere classified: Secondary | ICD-10-CM | POA: Diagnosis not present

## 2020-05-04 DIAGNOSIS — Z96641 Presence of right artificial hip joint: Secondary | ICD-10-CM | POA: Diagnosis not present

## 2020-05-04 DIAGNOSIS — M6281 Muscle weakness (generalized): Secondary | ICD-10-CM | POA: Diagnosis not present

## 2020-05-15 DIAGNOSIS — M25651 Stiffness of right hip, not elsewhere classified: Secondary | ICD-10-CM | POA: Diagnosis not present

## 2020-05-15 DIAGNOSIS — M1611 Unilateral primary osteoarthritis, right hip: Secondary | ICD-10-CM | POA: Diagnosis not present

## 2020-05-15 DIAGNOSIS — Z96641 Presence of right artificial hip joint: Secondary | ICD-10-CM | POA: Diagnosis not present

## 2020-05-15 DIAGNOSIS — Z471 Aftercare following joint replacement surgery: Secondary | ICD-10-CM | POA: Diagnosis not present

## 2020-05-15 DIAGNOSIS — M6281 Muscle weakness (generalized): Secondary | ICD-10-CM | POA: Diagnosis not present

## 2020-05-15 DIAGNOSIS — R2689 Other abnormalities of gait and mobility: Secondary | ICD-10-CM | POA: Diagnosis not present

## 2020-05-17 DIAGNOSIS — M1611 Unilateral primary osteoarthritis, right hip: Secondary | ICD-10-CM | POA: Diagnosis not present

## 2020-05-17 DIAGNOSIS — R2689 Other abnormalities of gait and mobility: Secondary | ICD-10-CM | POA: Diagnosis not present

## 2020-05-17 DIAGNOSIS — M6281 Muscle weakness (generalized): Secondary | ICD-10-CM | POA: Diagnosis not present

## 2020-05-17 DIAGNOSIS — M25651 Stiffness of right hip, not elsewhere classified: Secondary | ICD-10-CM | POA: Diagnosis not present

## 2020-05-17 DIAGNOSIS — Z471 Aftercare following joint replacement surgery: Secondary | ICD-10-CM | POA: Diagnosis not present

## 2020-05-17 DIAGNOSIS — Z96641 Presence of right artificial hip joint: Secondary | ICD-10-CM | POA: Diagnosis not present

## 2020-06-01 DIAGNOSIS — Z1231 Encounter for screening mammogram for malignant neoplasm of breast: Secondary | ICD-10-CM | POA: Diagnosis not present

## 2020-06-05 DIAGNOSIS — Z96641 Presence of right artificial hip joint: Secondary | ICD-10-CM | POA: Diagnosis not present

## 2020-06-05 DIAGNOSIS — M6281 Muscle weakness (generalized): Secondary | ICD-10-CM | POA: Diagnosis not present

## 2020-06-05 DIAGNOSIS — M25651 Stiffness of right hip, not elsewhere classified: Secondary | ICD-10-CM | POA: Diagnosis not present

## 2020-06-05 DIAGNOSIS — R2689 Other abnormalities of gait and mobility: Secondary | ICD-10-CM | POA: Diagnosis not present

## 2020-06-05 DIAGNOSIS — Z471 Aftercare following joint replacement surgery: Secondary | ICD-10-CM | POA: Diagnosis not present

## 2020-06-05 DIAGNOSIS — M1611 Unilateral primary osteoarthritis, right hip: Secondary | ICD-10-CM | POA: Diagnosis not present

## 2020-06-06 DIAGNOSIS — L84 Corns and callosities: Secondary | ICD-10-CM | POA: Diagnosis not present

## 2020-06-06 DIAGNOSIS — M79676 Pain in unspecified toe(s): Secondary | ICD-10-CM | POA: Diagnosis not present

## 2020-06-06 DIAGNOSIS — E1142 Type 2 diabetes mellitus with diabetic polyneuropathy: Secondary | ICD-10-CM | POA: Diagnosis not present

## 2020-06-06 DIAGNOSIS — B351 Tinea unguium: Secondary | ICD-10-CM | POA: Diagnosis not present

## 2020-06-07 DIAGNOSIS — M6281 Muscle weakness (generalized): Secondary | ICD-10-CM | POA: Diagnosis not present

## 2020-06-07 DIAGNOSIS — M25651 Stiffness of right hip, not elsewhere classified: Secondary | ICD-10-CM | POA: Diagnosis not present

## 2020-06-07 DIAGNOSIS — Z96641 Presence of right artificial hip joint: Secondary | ICD-10-CM | POA: Diagnosis not present

## 2020-06-07 DIAGNOSIS — R2689 Other abnormalities of gait and mobility: Secondary | ICD-10-CM | POA: Diagnosis not present

## 2020-06-07 DIAGNOSIS — Z471 Aftercare following joint replacement surgery: Secondary | ICD-10-CM | POA: Diagnosis not present

## 2020-06-07 DIAGNOSIS — M1611 Unilateral primary osteoarthritis, right hip: Secondary | ICD-10-CM | POA: Diagnosis not present

## 2020-06-09 DIAGNOSIS — M6281 Muscle weakness (generalized): Secondary | ICD-10-CM | POA: Diagnosis not present

## 2020-06-09 DIAGNOSIS — R2689 Other abnormalities of gait and mobility: Secondary | ICD-10-CM | POA: Diagnosis not present

## 2020-06-09 DIAGNOSIS — Z96641 Presence of right artificial hip joint: Secondary | ICD-10-CM | POA: Diagnosis not present

## 2020-06-09 DIAGNOSIS — Z471 Aftercare following joint replacement surgery: Secondary | ICD-10-CM | POA: Diagnosis not present

## 2020-06-09 DIAGNOSIS — M1611 Unilateral primary osteoarthritis, right hip: Secondary | ICD-10-CM | POA: Diagnosis not present

## 2020-06-09 DIAGNOSIS — M25651 Stiffness of right hip, not elsewhere classified: Secondary | ICD-10-CM | POA: Diagnosis not present

## 2020-06-13 DIAGNOSIS — Z13220 Encounter for screening for lipoid disorders: Secondary | ICD-10-CM | POA: Diagnosis not present

## 2020-06-13 DIAGNOSIS — E1165 Type 2 diabetes mellitus with hyperglycemia: Secondary | ICD-10-CM | POA: Diagnosis not present

## 2020-06-13 DIAGNOSIS — E7800 Pure hypercholesterolemia, unspecified: Secondary | ICD-10-CM | POA: Diagnosis not present

## 2020-06-13 DIAGNOSIS — E7801 Familial hypercholesterolemia: Secondary | ICD-10-CM | POA: Diagnosis not present

## 2020-06-13 DIAGNOSIS — I10 Essential (primary) hypertension: Secondary | ICD-10-CM | POA: Diagnosis not present

## 2020-06-15 DIAGNOSIS — E1140 Type 2 diabetes mellitus with diabetic neuropathy, unspecified: Secondary | ICD-10-CM | POA: Diagnosis not present

## 2020-06-15 DIAGNOSIS — E1165 Type 2 diabetes mellitus with hyperglycemia: Secondary | ICD-10-CM | POA: Diagnosis not present

## 2020-06-15 DIAGNOSIS — N390 Urinary tract infection, site not specified: Secondary | ICD-10-CM | POA: Diagnosis not present

## 2020-06-15 DIAGNOSIS — E039 Hypothyroidism, unspecified: Secondary | ICD-10-CM | POA: Diagnosis not present

## 2020-06-15 DIAGNOSIS — M129 Arthropathy, unspecified: Secondary | ICD-10-CM | POA: Diagnosis not present

## 2020-06-15 DIAGNOSIS — R062 Wheezing: Secondary | ICD-10-CM | POA: Diagnosis not present

## 2020-06-15 DIAGNOSIS — M810 Age-related osteoporosis without current pathological fracture: Secondary | ICD-10-CM | POA: Diagnosis not present

## 2020-06-15 DIAGNOSIS — I10 Essential (primary) hypertension: Secondary | ICD-10-CM | POA: Diagnosis not present

## 2020-06-15 DIAGNOSIS — K219 Gastro-esophageal reflux disease without esophagitis: Secondary | ICD-10-CM | POA: Diagnosis not present

## 2020-06-15 DIAGNOSIS — Z23 Encounter for immunization: Secondary | ICD-10-CM | POA: Diagnosis not present

## 2020-06-15 DIAGNOSIS — Z0001 Encounter for general adult medical examination with abnormal findings: Secondary | ICD-10-CM | POA: Diagnosis not present

## 2020-06-15 DIAGNOSIS — R319 Hematuria, unspecified: Secondary | ICD-10-CM | POA: Diagnosis not present

## 2020-07-19 ENCOUNTER — Ambulatory Visit (HOSPITAL_COMMUNITY)
Admission: RE | Admit: 2020-07-19 | Discharge: 2020-07-19 | Disposition: A | Discharge status: Home / Self Care | Payer: Medicare PPO | Source: Ambulatory Visit | Attending: Internal Medicine | Admitting: Internal Medicine

## 2020-07-19 ENCOUNTER — Other Ambulatory Visit: Payer: Self-pay

## 2020-07-19 ENCOUNTER — Other Ambulatory Visit: Payer: Self-pay | Admitting: Internal Medicine

## 2020-07-19 ENCOUNTER — Other Ambulatory Visit (HOSPITAL_COMMUNITY): Payer: Self-pay | Admitting: Internal Medicine

## 2020-07-19 DIAGNOSIS — M129 Arthropathy, unspecified: Secondary | ICD-10-CM | POA: Diagnosis not present

## 2020-07-19 DIAGNOSIS — E1140 Type 2 diabetes mellitus with diabetic neuropathy, unspecified: Secondary | ICD-10-CM | POA: Diagnosis not present

## 2020-07-19 DIAGNOSIS — R062 Wheezing: Secondary | ICD-10-CM | POA: Diagnosis not present

## 2020-07-19 DIAGNOSIS — E039 Hypothyroidism, unspecified: Secondary | ICD-10-CM | POA: Diagnosis not present

## 2020-07-19 DIAGNOSIS — M7989 Other specified soft tissue disorders: Secondary | ICD-10-CM | POA: Diagnosis not present

## 2020-07-19 DIAGNOSIS — E1165 Type 2 diabetes mellitus with hyperglycemia: Secondary | ICD-10-CM | POA: Diagnosis not present

## 2020-07-19 DIAGNOSIS — I10 Essential (primary) hypertension: Secondary | ICD-10-CM | POA: Diagnosis not present

## 2020-07-19 DIAGNOSIS — R600 Localized edema: Secondary | ICD-10-CM | POA: Diagnosis not present

## 2020-07-19 DIAGNOSIS — N390 Urinary tract infection, site not specified: Secondary | ICD-10-CM | POA: Diagnosis not present

## 2020-07-19 DIAGNOSIS — M79661 Pain in right lower leg: Secondary | ICD-10-CM | POA: Diagnosis not present

## 2020-07-25 DIAGNOSIS — B351 Tinea unguium: Secondary | ICD-10-CM | POA: Diagnosis not present

## 2020-07-25 DIAGNOSIS — E1142 Type 2 diabetes mellitus with diabetic polyneuropathy: Secondary | ICD-10-CM | POA: Diagnosis not present

## 2020-07-25 DIAGNOSIS — M79676 Pain in unspecified toe(s): Secondary | ICD-10-CM | POA: Diagnosis not present

## 2020-07-25 DIAGNOSIS — L84 Corns and callosities: Secondary | ICD-10-CM | POA: Diagnosis not present

## 2020-08-17 DIAGNOSIS — H40013 Open angle with borderline findings, low risk, bilateral: Secondary | ICD-10-CM | POA: Diagnosis not present

## 2020-08-17 DIAGNOSIS — H04123 Dry eye syndrome of bilateral lacrimal glands: Secondary | ICD-10-CM | POA: Diagnosis not present

## 2020-08-18 DIAGNOSIS — N390 Urinary tract infection, site not specified: Secondary | ICD-10-CM | POA: Diagnosis not present

## 2020-08-18 DIAGNOSIS — R062 Wheezing: Secondary | ICD-10-CM | POA: Diagnosis not present

## 2020-08-18 DIAGNOSIS — E1140 Type 2 diabetes mellitus with diabetic neuropathy, unspecified: Secondary | ICD-10-CM | POA: Diagnosis not present

## 2020-08-18 DIAGNOSIS — I10 Essential (primary) hypertension: Secondary | ICD-10-CM | POA: Diagnosis not present

## 2020-08-18 DIAGNOSIS — K219 Gastro-esophageal reflux disease without esophagitis: Secondary | ICD-10-CM | POA: Diagnosis not present

## 2020-08-18 DIAGNOSIS — R600 Localized edema: Secondary | ICD-10-CM | POA: Diagnosis not present

## 2020-08-18 DIAGNOSIS — E1165 Type 2 diabetes mellitus with hyperglycemia: Secondary | ICD-10-CM | POA: Diagnosis not present

## 2020-08-18 DIAGNOSIS — E039 Hypothyroidism, unspecified: Secondary | ICD-10-CM | POA: Diagnosis not present

## 2020-08-18 DIAGNOSIS — M129 Arthropathy, unspecified: Secondary | ICD-10-CM | POA: Diagnosis not present

## 2020-08-23 ENCOUNTER — Ambulatory Visit (INDEPENDENT_AMBULATORY_CARE_PROVIDER_SITE_OTHER): Payer: Medicare PPO

## 2020-08-23 ENCOUNTER — Encounter: Payer: Self-pay | Admitting: Orthopaedic Surgery

## 2020-08-23 ENCOUNTER — Other Ambulatory Visit: Payer: Self-pay

## 2020-08-23 ENCOUNTER — Ambulatory Visit: Payer: Medicare PPO | Admitting: Orthopaedic Surgery

## 2020-08-23 DIAGNOSIS — M1711 Unilateral primary osteoarthritis, right knee: Secondary | ICD-10-CM

## 2020-08-23 DIAGNOSIS — M5441 Lumbago with sciatica, right side: Secondary | ICD-10-CM

## 2020-08-23 DIAGNOSIS — M25561 Pain in right knee: Secondary | ICD-10-CM

## 2020-08-23 DIAGNOSIS — Z96641 Presence of right artificial hip joint: Secondary | ICD-10-CM | POA: Diagnosis not present

## 2020-08-23 DIAGNOSIS — M4807 Spinal stenosis, lumbosacral region: Secondary | ICD-10-CM

## 2020-08-23 MED ORDER — ACETAMINOPHEN-CODEINE #3 300-30 MG PO TABS
1.0000 | ORAL_TABLET | Freq: Three times a day (TID) | ORAL | 0 refills | Status: DC | PRN
Start: 2020-08-23 — End: 2022-12-04

## 2020-08-23 MED ORDER — METHYLPREDNISOLONE ACETATE 40 MG/ML IJ SUSP
40.0000 mg | INTRAMUSCULAR | Status: AC | PRN
Start: 2020-08-23 — End: 2020-08-23
  Administered 2020-08-23: 11:00:00 40 mg via INTRA_ARTICULAR

## 2020-08-23 MED ORDER — LIDOCAINE HCL 1 % IJ SOLN
3.0000 mL | INTRAMUSCULAR | Status: AC | PRN
  Administered 2020-08-23: 11:00:00 3 mL

## 2020-08-23 NOTE — Progress Notes (Signed)
Office Visit Note   Patient: Whitney Gill           Date of Birth: 17-Jan-1946           MRN: [id number] Visit Date: 08/23/2020              Requested by: Donetta Potts, MD [address] [place],  [place] [address] PCP: Donetta Potts, MD   Assessment & Plan: Visit Diagnoses:  1. Right knee pain, unspecified chronicity   2. Status post total replacement of right hip   3. Right-sided low back pain with right-sided sciatica, unspecified chronicity   4. Unilateral primary osteoarthritis, right knee     Plan:   I do feel that her pain is multifactorial.  I gave her reassurance of the right total hip arthroplasty components appear to be intact and look good on x-ray and on my clinical exam.  Certainly there may be some bursitis associated with having a joint replacement on the lateral aspect of her right hip but I think there is certainly issues from her lumbar spine and her knee that are contributing to this.  We did decide to place a steroid injection her right knee and she will watch her blood glucose closely.  I will send in some Tylenol 3 to help her in the evening and want her to really back off her exercise routine and only now just walk or ride her recumbent bike.  I would like to obtain an MRI of the lumbar spine to see if there is any nerve compression that is contributing to her radicular symptoms going down her right leg.  We will see her back in follow-up once we have the MRI of her lumbar spine.  All questions and concerns were answered and addressed.  Follow-Up Instructions: No follow-ups on file.   Orders:  Orders Placed This Encounter  Procedures   Large Joint Inj: R knee   XR Lumbar Spine 2-3 Views   XR HIP UNILAT W OR W/O PELVIS 2-3 VIEWS RIGHT   XR Knee 1-2 Views Right   Meds ordered this encounter  Medications   acetaminophen-codeine (TYLENOL #3) 300-30 MG tablet    Sig: Take 1-2 tablets by mouth every 8 (eight) hours as needed.    Dispense:  30 tablet     Refill:  0       Procedures: Large Joint Inj: R knee on 08/23/2020 10:43 AM Indications: diagnostic evaluation and pain Details: 22 G 1.5 in needle, superolateral approach  Arthrogram: No  Medications: 3 mL lidocaine 1 %; 40 mg methylPREDNISolone acetate 40 MG/ML Outcome: tolerated well, no immediate complications Procedure, treatment alternatives, risks and benefits explained, specific risks discussed. Consent was given by the patient. Immediately prior to procedure a time out was called to verify the correct patient, procedure, equipment, support staff and site/side marked as required. Patient was prepped and draped in the usual sterile fashion.      Clinical Data: No additional findings.   Subjective: Chief Complaint  Patient presents with   Right Hip - Pain   Lower Back - Pain  The patient is well-known to me.  She is getting close to 6 months out from a right anterior hip replacement.  She is still having significant right hip pain but there is also component of her pain is radicular in nature around the sciatic region down to her knee and even down to her foot.  She has remote history of a right knee  arthroscopy many years ago.  Her knee has been bothering her some as well.  She is not sleeping well.  Her right leg is been swollen some and an ultrasound was negative for DVT.  Again she does report some numbness and tingling in her foot as well.  She is very active and at 53 does a significant exercise routine.  This certainly could be contributing to her pain as well.  She states that her blood glucose has been up and down.  She did describe to me her full exercise routine.  It is impressive.  HPI  Review of Systems There is currently listed no headache, chest pain, shortness of breath, fever, chills, nausea, vomiting  Objective: Vital Signs: There were no vitals taken for this visit.  Physical Exam She is alert and orient x3 and in no acute distress Ortho  Exam Examination of her right operative hip shows it moves smoothly and fluidly with no blocks or rotation and no pain on my exam.  There are some mild pain over the trochanteric area.  She has right-sided low back pain and sciatic pain.  Her right knee shows patellofemoral crepitation and medial lateral joint line tenderness and there is some grinding in that knee when I put her back and forth the range of motion of the knee.  Her foot and ankle exam showed that she moves normal in that foot and ankle but there is some subjective numbness around L4 and L5. Specialty Comments:  No specialty comments available.  Imaging: XR HIP UNILAT W OR W/O PELVIS 2-3 VIEWS RIGHT  Result Date: 08/23/2020 An AP pelvis and lateral of the right hip shows a well-seated total hip arthroplasty with no complicating features.  The AP view does show a left hip that does have moderate arthritis with joint space narrowing and para-articular osteophytes.  XR Knee 1-2 Views Right  Result Date: 08/23/2020 2 views of the right knee show significant tricompartment arthritis with bone-on-bone wear of the lateral compartment and osteophytes in all 3 compartments.  XR Lumbar Spine 2-3 Views  Result Date: 08/23/2020 2 views of the lumbar spine show no acute findings.  There is significant degenerative changes in the posterior elements of the lower lumbar spine.  There is slight anterolisthesis of L4 on L5.    PMFS History: Patient Active Problem List   Diagnosis Date Noted   Unilateral primary osteoarthritis, right knee 08/23/2020   Status post total hip replacement, right 03/03/2020   Unilateral primary osteoarthritis, right hip 11/11/2019   Special screening for malignant neoplasms, colon 08/05/2018   Cough 08/05/2018   GERD (gastroesophageal reflux disease) 08/12/2014   Essential hypertension 08/12/2014   Upper airway cough syndrome 08/11/2014   Past Medical History:  Diagnosis Date   Arthritis    hip, knee    Diabetes mellitus without complication (HCC)    Hypertension    Hypothyroidism     Family History  Problem Relation Age of Onset   COPD Mother        smoked   Heart attack Father    Diabetes Father    Breast cancer Maternal Aunt     Past Surgical History:  Procedure Laterality Date   BREAST BIOPSY     COLONOSCOPY N/A 11/25/2018   Procedure: COLONOSCOPY;  Surgeon: Malissa Hippo, MD;  Location: [facility];  Service: Endoscopy;  Laterality: N/A;  1030   ESOPHAGOGASTRODUODENOSCOPY N/A 11/25/2018   Procedure: ESOPHAGOGASTRODUODENOSCOPY (EGD);  Surgeon: Malissa Hippo, MD;  Location:  [facility];  Service: Endoscopy;  Laterality: N/A;   KNEE ARTHROSCOPY Right 1985   L ankle surgery Left 2007,2010,2011   SHOULDER SURGERY Right 1998   TONSILLECTOMY     TOTAL HIP ARTHROPLASTY Right 03/03/2020   Procedure: RIGHT TOTAL HIP ARTHROPLASTY ANTERIOR APPROACH;  Surgeon: Kathryne Hitch, MD;  Location: [facility];  Service: Orthopedics;  Laterality: Right;   Social History   Occupational History   Not on file  Tobacco Use   Smoking status: Never   Smokeless tobacco: Never  Vaping Use   Vaping Use: Never used  Substance and Sexual Activity   Alcohol use: No    Alcohol/week: 0.0 standard drinks   Drug use: No   Sexual activity: Not on file

## 2020-09-08 DIAGNOSIS — M129 Arthropathy, unspecified: Secondary | ICD-10-CM | POA: Diagnosis not present

## 2020-09-08 DIAGNOSIS — E1140 Type 2 diabetes mellitus with diabetic neuropathy, unspecified: Secondary | ICD-10-CM | POA: Diagnosis not present

## 2020-09-08 DIAGNOSIS — E039 Hypothyroidism, unspecified: Secondary | ICD-10-CM | POA: Diagnosis not present

## 2020-09-08 DIAGNOSIS — Z6830 Body mass index (BMI) 30.0-30.9, adult: Secondary | ICD-10-CM | POA: Diagnosis not present

## 2020-09-08 DIAGNOSIS — K219 Gastro-esophageal reflux disease without esophagitis: Secondary | ICD-10-CM | POA: Diagnosis not present

## 2020-09-08 DIAGNOSIS — E1165 Type 2 diabetes mellitus with hyperglycemia: Secondary | ICD-10-CM | POA: Diagnosis not present

## 2020-09-08 DIAGNOSIS — I10 Essential (primary) hypertension: Secondary | ICD-10-CM | POA: Diagnosis not present

## 2020-09-12 ENCOUNTER — Telehealth: Payer: Self-pay | Admitting: Orthopaedic Surgery

## 2020-09-12 DIAGNOSIS — L97511 Non-pressure chronic ulcer of other part of right foot limited to breakdown of skin: Secondary | ICD-10-CM | POA: Diagnosis not present

## 2020-09-12 NOTE — Telephone Encounter (Signed)
Lvm informing pt to contact pcp

## 2020-09-12 NOTE — Telephone Encounter (Signed)
No shots were done at the hospital right?

## 2020-09-12 NOTE — Telephone Encounter (Signed)
Whitney Gill called and is wondering if she had a tetanus shot?  CB [phone]

## 2020-09-14 ENCOUNTER — Ambulatory Visit
Admission: RE | Admit: 2020-09-14 | Discharge: 2020-09-14 | Disposition: A | Discharge status: Home / Self Care | Payer: Medicare PPO | Source: Ambulatory Visit | Attending: Orthopaedic Surgery | Admitting: Orthopaedic Surgery

## 2020-09-14 ENCOUNTER — Other Ambulatory Visit: Payer: Self-pay

## 2020-09-14 DIAGNOSIS — M545 Low back pain, unspecified: Secondary | ICD-10-CM | POA: Diagnosis not present

## 2020-09-14 DIAGNOSIS — M48061 Spinal stenosis, lumbar region without neurogenic claudication: Secondary | ICD-10-CM | POA: Diagnosis not present

## 2020-09-14 DIAGNOSIS — M4807 Spinal stenosis, lumbosacral region: Secondary | ICD-10-CM

## 2020-09-14 NOTE — Progress Notes (Signed)
CARDIOLOGY CONSULT NOTE       Patient ID: Whitney Gill MRN: [id number] DOB/AGE: [birth year] 75 y.o.  Admit date: (Not on file) Referring Physician: Roger Shelter Primary Physician: Donetta Potts, MD Primary Cardiologist: new Reason for Consultation: Chest Pain/Dyspnea     HPI:  75 y.o. referred by Dr Roger Shelter for chest pain and dyspnea History of DM, HTN and hypothyroidism Reviewed His office note from 08/18/20 Hip and right leg pain Stopping statin didn't help [place] 07/19/20 negative DVT Dyspnea is Chronic not pleuritic infrequent atypical left sided non radiating not always exertional Nothing makes it better / worse Dad had MI but was 43, brother CABG   Statin was restarted M/W/F  "[name]" is excitable and has lots of energy She is concerned about her ongoing right leg pain after THR In January MRI back ok but may have arthritic knee issues   Dyspnea seems functional Chest pain is atypical  Son is Runner, broadcasting/film/video at [employer] and she has lots of grand children   ROS All other systems reviewed and negative except as noted above  Past Medical History:  Diagnosis Date   Arthritis    hip, knee   Diabetes mellitus without complication (HCC)    Hypertension    Hypothyroidism     Family History  Problem Relation Age of Onset   COPD Mother        smoked   Heart attack Father    Diabetes Father    Breast cancer Maternal Aunt     Social History   Socioeconomic History   Marital status: Married    Spouse name: Not on file   Number of children: Not on file   Years of education: Not on file   Highest education level: Not on file  Occupational History   Not on file  Tobacco Use   Smoking status: Never   Smokeless tobacco: Never  Vaping Use   Vaping Use: Never used  Substance and Sexual Activity   Alcohol use: No    Alcohol/week: 0.0 standard drinks   Drug use: No   Sexual activity: Not on file  Other Topics Concern   Not on file  Social History Narrative   Not on  file   Social Determinants of Health   Financial Resource Strain: Not on file  Food Insecurity: Not on file  Transportation Needs: Not on file  Physical Activity: Not on file  Stress: Not on file  Social Connections: Not on file  Intimate Partner Violence: Not on file    Past Surgical History:  Procedure Laterality Date   BREAST BIOPSY     COLONOSCOPY N/A 11/25/2018   Procedure: COLONOSCOPY;  Surgeon: Malissa Hippo, MD;  [facility];  Service: Endoscopy;  Laterality: N/A;  1030   ESOPHAGOGASTRODUODENOSCOPY N/A 11/25/2018   Procedure: ESOPHAGOGASTRODUODENOSCOPY (EGD);  Surgeon: Malissa Hippo, MD;  [facility];  Service: Endoscopy;  Laterality: N/A;   KNEE ARTHROSCOPY Right 1985   L ankle surgery Left 2007,2010,2011   SHOULDER SURGERY Right 1998   TONSILLECTOMY     TOTAL HIP ARTHROPLASTY Right 03/03/2020   Procedure: RIGHT TOTAL HIP ARTHROPLASTY ANTERIOR APPROACH;  Surgeon: Kathryne Hitch, MD;  Location: [facility];  Service: Orthopedics;  Laterality: Right;      Current Outpatient Medications:    acetaminophen (TYLENOL) 650 MG CR tablet, Take 650-1,300 mg by mouth every 8 (eight) hours as needed for pain., Disp: , Rfl:    albuterol (VENTOLIN HFA) 108 (90  Base) MCG/ACT inhaler, Inhale 2 puffs into the lungs 4 (four) times daily as needed (whezzing /  sob). As needed., Disp: , Rfl:    aspirin 81 MG chewable tablet, Chew 1 tablet (81 mg total) by mouth 2 (two) times daily., Disp: 30 tablet, Rfl: 0   atorvastatin (LIPITOR) 20 MG tablet, Take 20 mg by mouth every Monday, Wednesday, and Friday., Disp: , Rfl:    Calcium Carb-Cholecalciferol (CALTRATE 600+D3 PO), Take 1 tablet by mouth daily., Disp: , Rfl:    Coenzyme Q10 (COQ10) 200 MG CAPS, Take 200 mg by mouth at bedtime. , Disp: , Rfl:    fluticasone (FLONASE) 50 MCG/ACT nasal spray, Place 2 sprays into both nostrils daily as needed (allergies.). , Disp: , Rfl:    hydrochlorothiazide (MICROZIDE) 12.5 MG  capsule, Take 12.5 mg by mouth daily., Disp: , Rfl:    ipratropium (ATROVENT) 0.06 % nasal spray, Place 2 sprays into the nose 4 (four) times daily as needed., Disp: , Rfl:    levothyroxine (SYNTHROID, LEVOTHROID) 75 MCG tablet, Take 75 mcg by mouth daily before breakfast. , Disp: , Rfl:    losartan (COZAAR) 25 MG tablet, Take 25 mg by mouth daily., Disp: , Rfl:    Menthol, Topical Analgesic, (BIOFREEZE) 4 % GEL, Apply 1 application topically daily as needed (pain)., Disp: , Rfl:    metFORMIN (GLUCOPHAGE-XR) 500 MG 24 hr tablet, Take 500 mg by mouth 2 (two) times daily., Disp: , Rfl:    Multiple Vitamin (MULTIVITAMIN WITH MINERALS) TABS tablet, Take 1 tablet by mouth daily. Centrum Silver, Disp: , Rfl:    OVER THE COUNTER MEDICATION, Apply 1 application topically daily as needed (pain). CBD Salve, Disp: , Rfl:    pantoprazole (PROTONIX) 20 MG tablet, Take 20 mg by mouth at bedtime., Disp: , Rfl:    Probiotic Product (ALIGN) 4 MG CAPS, Take 4 mg by mouth daily. , Disp: , Rfl:    Turmeric 500 MG CAPS, Take 500 mg by mouth at bedtime., Disp: , Rfl:    Vitamin D, Cholecalciferol, 25 MCG (1000 UT) CAPS, Take 1,000 Units by mouth in the morning and at bedtime., Disp: , Rfl:    acetaminophen-codeine (TYLENOL #3) 300-30 MG tablet, Take 1-2 tablets by mouth every 8 (eight) hours as needed. (Patient not taking: Reported on 09/20/2020), Disp: 30 tablet, Rfl: 0   methocarbamol (ROBAXIN) 500 MG tablet, Take 1 tablet (500 mg total) by mouth every 6 (six) hours as needed for muscle spasms., Disp: 30 tablet, Rfl: 1   oxyCODONE (OXY IR/ROXICODONE) 5 MG immediate release tablet, Take 1-2 tablets (5-10 mg total) by mouth every 4 (four) hours as needed for moderate pain (pain score 4-6)., Disp: 30 tablet, Rfl: 0   Polyethyl Glycol-Propyl Glycol (LUBRICANT EYE DROPS) 0.4-0.3 % SOLN, Place 1 drop into both eyes 4 (four) times daily as needed (dry/irritated eyes.). (Patient not taking: Reported on 09/20/2020), Disp: , Rfl:     RESTASIS 0.05 % ophthalmic emulsion, 1 drop 2 (two) times daily., Disp: , Rfl:     Physical Exam: Pulse 88, height 5' 6.5" (1.689 m), weight 83.5 kg, SpO2 97 %.    Affect appropriate Healthy:  appears stated age HEENT: normal Neck supple with no adenopathy JVP normal no bruits no thyromegaly Lungs clear with no wheezing and good diaphragmatic motion Heart:  S1/S2 no murmur, no rub, gallop or click PMI normal Abdomen: benighn, BS positve, no tenderness, no AAA no bruit.  No HSM or HJR Distal pulses intact  with no bruits No edema Neuro non-focal Skin warm and dry No muscular weakness   Labs:   Lab Results  Component Value Date   WBC 10.4 03/05/2020   HGB 11.1 (L) 03/05/2020   HCT 32.2 (L) 03/05/2020   MCV 98.8 03/05/2020   PLT 156 03/05/2020   No results for input(s): NA, K, CL, CO2, BUN, CREATININE, CALCIUM, PROT, BILITOT, ALKPHOS, ALT, AST, GLUCOSE in the last 168 hours.  Invalid input(s): LABALBU No results found for: CKTOTAL, CKMB, CKMBINDEX, TROPONINI No results found for: CHOL No results found for: HDL No results found for: LDLCALC No results found for: TRIG No results found for: CHOLHDL No results found for: LDLDIRECT    Radiology: MR Lumbar Spine w/o contrast  Result Date: 09/15/2020 CLINICAL DATA:  Lumbosacral spinal stenosis.  Right leg pain EXAM: MRI LUMBAR SPINE WITHOUT CONTRAST TECHNIQUE: Multiplanar, multisequence MR imaging of the lumbar spine was performed. No intravenous contrast was administered. COMPARISON:  Lumbar spine radiographs 08/23/2020 FINDINGS: Segmentation:  Normal Alignment: Slight retrolisthesis L1-2. Mild anterolisthesis L2-3 and L4-5 Vertebrae:  Negative for fracture or mass.  Normal bone marrow. Conus medullaris and cauda equina: Conus extends to the L1-2 level. Conus and cauda equina appear normal. Paraspinal and other soft tissues: Negative for paraspinous mass or adenopathy. No soft tissue edema. Disc levels: T12-L1: Mild disc and  facet degeneration. Mild disc bulging without stenosis L2-3: Mild disc and mild facet degeneration. Negative for disc protrusion or stenosis L2-3: Mild anterolisthesis. Diffuse disc bulging and moderate facet hypertrophy. Mild spinal stenosis and mild subarticular stenosis bilaterally. L3-4: Disc degeneration with disc bulging. Moderate to advanced facet hypertrophy. Mild spinal stenosis and mild subarticular stenosis bilaterally. L4-5: 3 mm anterolisthesis with moderate to severe facet degeneration. Mild disc degeneration. Mild subarticular stenosis bilaterally. L5-S1: Mild disc degeneration. Moderate to severe facet hypertrophy asymmetric on the right. This is contributing to right subarticular stenosis with possible right S1 nerve root impingement. Negative for disc protrusion. IMPRESSION: Multilevel disc and facet degeneration in the lumbar spine causing spinal and subarticular stenosis bilaterally. Possible right S1 nerve root impingement due to facet hypertrophy. Electronically Signed   By: Marlan Palau M.D.   On: 09/15/2020 07:55   XR HIP UNILAT W OR W/O PELVIS 2-3 VIEWS RIGHT  Result Date: 08/23/2020 An AP pelvis and lateral of the right hip shows a well-seated total hip arthroplasty with no complicating features.  The AP view does show a left hip that does have moderate arthritis with joint space narrowing and para-articular osteophytes.  XR Knee 1-2 Views Right  Result Date: 08/23/2020 2 views of the right knee show significant tricompartment arthritis with bone-on-bone wear of the lateral compartment and osteophytes in all 3 compartments.  XR Lumbar Spine 2-3 Views  Result Date: 08/23/2020 2 views of the lumbar spine show no acute findings.  There is significant degenerative changes in the posterior elements of the lower lumbar spine.  There is slight anterolisthesis of L4 on L5.   EKG: SR PAC poor R wave progression 02/29/20   ASSESSMENT AND PLAN:   Chest Pain: atypical CRF;s HTN, HLD,  DM f/u lexiscan myovue  Dyspnea:  normal exam f/u echo assess LV/RV function CXR to screen lungs HTN:  Well controlled.  Continue current medications and low sodium Dash type diet.   HLD:  on statin 3 days per week f/u primary  DM:  Discussed low carb diet.  Target hemoglobin A1c is 6.5 or less.  Continue current medications. GERD:  continue  protonix Hypothyroidism:  continue synthroid replacement labs with primary   TTE Lexiscan myovue  F/U PRN if low risk   Signed: Charlton Haws 09/20/2020, 9:24 AM

## 2020-09-20 ENCOUNTER — Ambulatory Visit: Payer: Medicare PPO | Admitting: Cardiovascular Disease

## 2020-09-20 ENCOUNTER — Ambulatory Visit: Payer: Medicare PPO | Admitting: Orthopaedic Surgery

## 2020-09-20 ENCOUNTER — Encounter: Payer: Self-pay | Admitting: *Deleted

## 2020-09-20 ENCOUNTER — Encounter: Payer: Self-pay | Admitting: Orthopaedic Surgery

## 2020-09-20 ENCOUNTER — Other Ambulatory Visit: Payer: Self-pay

## 2020-09-20 VITALS — BP 128/60 | HR 88 | Ht 66.5 in | Wt 184.0 lb

## 2020-09-20 DIAGNOSIS — M7061 Trochanteric bursitis, right hip: Secondary | ICD-10-CM

## 2020-09-20 DIAGNOSIS — M1711 Unilateral primary osteoarthritis, right knee: Secondary | ICD-10-CM

## 2020-09-20 DIAGNOSIS — Z96641 Presence of right artificial hip joint: Secondary | ICD-10-CM

## 2020-09-20 DIAGNOSIS — I10 Essential (primary) hypertension: Secondary | ICD-10-CM

## 2020-09-20 DIAGNOSIS — E782 Mixed hyperlipidemia: Secondary | ICD-10-CM

## 2020-09-20 DIAGNOSIS — M4807 Spinal stenosis, lumbosacral region: Secondary | ICD-10-CM | POA: Diagnosis not present

## 2020-09-20 DIAGNOSIS — R0602 Shortness of breath: Secondary | ICD-10-CM

## 2020-09-20 DIAGNOSIS — R079 Chest pain, unspecified: Secondary | ICD-10-CM

## 2020-09-20 MED ORDER — LIDOCAINE HCL 1 % IJ SOLN
3.0000 mL | INTRAMUSCULAR | Status: AC | PRN
  Administered 2020-09-20: 3 mL

## 2020-09-20 MED ORDER — METHYLPREDNISOLONE ACETATE 40 MG/ML IJ SUSP
40.0000 mg | INTRAMUSCULAR | Status: AC | PRN
  Administered 2020-09-20: 40 mg via INTRA_ARTICULAR

## 2020-09-20 NOTE — Patient Instructions (Signed)
Medication Instructions:  Your physician recommends that you continue on your current medications as directed. Please refer to the Current Medication list given to you today.  *If you need a refill on your cardiac medications before your next appointment, please call your pharmacy*   Lab Work: NONE   If you have labs (blood work) drawn today and your tests are completely normal, you will receive your results only by: MyChart Message (if you have MyChart) OR A paper copy in the mail If you have any lab test that is abnormal or we need to change your treatment, we will call you to review the results.   Testing/Procedures: Your physician has requested that you have an echocardiogram. Echocardiography is a painless test that uses sound waves to create images of your heart. It provides your doctor with information about the size and shape of your heart and how well your heart's chambers and valves are working. This procedure takes approximately one hour. There are no restrictions for this procedure.  Your physician has requested that you have a lexiscan myoview. For further information please visit [email or web address]. Please follow instruction sheet, as given.    Follow-Up: At [facility], you and your health needs are our priority.  As part of our continuing mission to provide you with exceptional heart care, we have created designated Provider Care Teams.  These Care Teams include your primary Cardiologist (physician) and Advanced Practice Providers (APPs -  Physician Assistants and Nurse Practitioners) who all work together to provide you with the care you need, when you need it.  We recommend signing up for the patient portal called "MyChart".  Sign up information is provided on this After Visit Summary.  MyChart is used to connect with patients for Virtual Visits (Telemedicine).  Patients are able to view lab/test results, encounter notes, upcoming appointments, etc.  Non-urgent messages  can be sent to your provider as well.   To learn more about what you can do with MyChart, go to [email or web address].    Your next appointment:    As Needed   The format for your next appointment:   In Person  Provider:   Peter Nishan, MD   Other Instructions Thank you for choosing [facility]!    

## 2020-09-20 NOTE — Progress Notes (Signed)
Office Visit Note   Patient: Whitney Gill           Date of Birth: 1946-02-14           MRN: [id number] Visit Date: 09/20/2020              Requested by: Donetta Potts, MD [address] [place],  [place] [address] PCP: Donetta Potts, MD   Assessment & Plan: Visit Diagnoses:  1. Spinal stenosis of lumbosacral region   2. Status post total replacement of right hip   3. Unilateral primary osteoarthritis, right knee   4. Trochanteric bursitis, right hip     Plan:   Unfortunately I do feel that at some point she will benefit from a right knee replacement.  I did recommend and place a steroid junction around her right hip trochanteric area.  I have also recommended outpatient physical therapy for any modalities that can decrease the pain along her right hip trochanteric area and IT band.  Right now we will hold off on any type of intervention for her spine.  All questions and concerns were answered addressed.  I would like to see her back in 4 weeks but no x-rays are needed.  Follow-Up Instructions: Return in about 4 weeks (around 10/18/2020).   Orders:  Orders Placed This Encounter  Procedures   Large Joint Inj   No orders of the defined types were placed in this encounter.     Procedures: Large Joint Inj: R greater trochanter on 09/20/2020 5:32 PM Indications: pain and diagnostic evaluation Details: 22 G 1.5 in needle, lateral approach  Arthrogram: No  Medications: 3 mL lidocaine 1 %; 40 mg methylPREDNISolone acetate 40 MG/ML Outcome: tolerated well, no immediate complications Procedure, treatment alternatives, risks and benefits explained, specific risks discussed. Consent was given by the patient. Immediately prior to procedure a time out was called to verify the correct patient, procedure, equipment, support staff and site/side marked as required. Patient was prepped and draped in the usual sterile fashion.      Clinical Data: No additional  findings.   Subjective: Chief Complaint  Patient presents with   Lower Back - Follow-up  The patient comes in today to go over MRI of her lumbar spine.  She is well-known to me having had a right hip replacement in January of this year.  She unfortunately has known severe arthritis in her right knee.  She has been dealing with pain in her right hip that radiates down the side of her right leg to her knee but also radiating to her foot.  We sent her recently for MRI of her lumbar spine given her radicular symptoms.  She still has a lot of pain along her hip as well but not in the groin.  This is on the right side.  HPI  Review of Systems There is currently listed no headache, chest pain, shortness of breath, fever, chills, nausea, vomiting  Objective: Vital Signs: There were no vitals taken for this visit.  Physical Exam She is alert and oriented x3 and in no acute distress Ortho Exam Examination of her right hip shows it moves smoothly and fluidly.  She does have significant pain over the right hip trochanteric area and down the right thigh IT band to the knee.  Her right knee is painful and she has good strength in her lower extremity. Specialty Comments:  No specialty comments available.  Imaging: No results found. The MRI of her lumbar  spine shows multilevel disc and facet degeneration and possibly a right S1 nerve impingement due to facet hypertrophy.  She is not having any low back pain at all and this may account for some of the numbness in the foot.  PMFS History: Patient Active Problem List   Diagnosis Date Noted   Unilateral primary osteoarthritis, right knee 08/23/2020   Status post total hip replacement, right 03/03/2020   Unilateral primary osteoarthritis, right hip 11/11/2019   Special screening for malignant neoplasms, colon 08/05/2018   Cough 08/05/2018   GERD (gastroesophageal reflux disease) 08/12/2014   Essential hypertension 08/12/2014   Upper airway cough  syndrome 08/11/2014   Past Medical History:  Diagnosis Date   Arthritis    hip, knee   Diabetes mellitus without complication (HCC)    Hypertension    Hypothyroidism     Family History  Problem Relation Age of Onset   COPD Mother        smoked   Heart attack Father    Diabetes Father    Breast cancer Maternal Aunt     Past Surgical History:  Procedure Laterality Date   BREAST BIOPSY     COLONOSCOPY N/A 11/25/2018   Procedure: COLONOSCOPY;  Surgeon: Malissa Hippo, MD;  [facility];  Service: Endoscopy;  Laterality: N/A;  1030   ESOPHAGOGASTRODUODENOSCOPY N/A 11/25/2018   Procedure: ESOPHAGOGASTRODUODENOSCOPY (EGD);  Surgeon: Malissa Hippo, MD;  [facility];  Service: Endoscopy;  Laterality: N/A;   KNEE ARTHROSCOPY Right 1985   L ankle surgery Left 2007,2010,2011   SHOULDER SURGERY Right 1998   TONSILLECTOMY     TOTAL HIP ARTHROPLASTY Right 03/03/2020   Procedure: RIGHT TOTAL HIP ARTHROPLASTY ANTERIOR APPROACH;  Surgeon: Kathryne Hitch, MD;  Location[facility];  Service: Orthopedics;  Laterality: Right;   Social History   Occupational History   Not on file  Tobacco Use   Smoking status: Never   Smokeless tobacco: Never  Vaping Use   Vaping Use: Never used  Substance and Sexual Activity   Alcohol use: No    Alcohol/week: 0.0 standard drinks   Drug use: No   Sexual activity: Not on file

## 2020-10-05 ENCOUNTER — Encounter (HOSPITAL_COMMUNITY): Payer: Self-pay

## 2020-10-05 ENCOUNTER — Other Ambulatory Visit: Payer: Self-pay

## 2020-10-05 ENCOUNTER — Ambulatory Visit (HOSPITAL_COMMUNITY)
Admission: RE | Admit: 2020-10-05 | Discharge: 2020-10-05 | Disposition: A | Discharge status: Home / Self Care | Payer: Medicare PPO | Source: Ambulatory Visit | Attending: Cardiovascular Disease | Admitting: Cardiovascular Disease

## 2020-10-05 ENCOUNTER — Telehealth: Payer: Self-pay

## 2020-10-05 ENCOUNTER — Encounter (HOSPITAL_COMMUNITY)
Admission: RE | Admit: 2020-10-05 | Discharge: 2020-10-05 | Disposition: A | Discharge status: Home / Self Care | Payer: Medicare PPO | Source: Ambulatory Visit | Attending: Cardiovascular Disease | Admitting: Cardiovascular Disease

## 2020-10-05 DIAGNOSIS — R0602 Shortness of breath: Secondary | ICD-10-CM | POA: Diagnosis not present

## 2020-10-05 DIAGNOSIS — R079 Chest pain, unspecified: Secondary | ICD-10-CM | POA: Diagnosis not present

## 2020-10-05 HISTORY — DX: Unspecified asthma, uncomplicated: J45.909

## 2020-10-05 LAB — ECHOCARDIOGRAM COMPLETE
Area-P 1/2: 2.75 cm2
S' Lateral: 2.6 cm

## 2020-10-05 LAB — NM MYOCAR MULTI W/SPECT W/WALL MOTION / EF
LV dias vol: 58 mL (ref 46–106)
LV sys vol: 13 mL
Peak HR: 103 {beats}/min
RATE: 0.51
Rest HR: 81 {beats}/min
SDS: 0
SRS: 1
SSS: 1
TID: 1.18

## 2020-10-05 MED ORDER — TECHNETIUM TC 99M TETROFOSMIN IV KIT
30.0000 | PACK | Freq: Once | INTRAVENOUS | Status: AC | PRN
  Administered 2020-10-05: 31.5 via INTRAVENOUS

## 2020-10-05 MED ORDER — SODIUM CHLORIDE FLUSH 0.9 % IV SOLN
INTRAVENOUS | Status: AC
  Administered 2020-10-05: 10 mL via INTRAVENOUS
  Filled 2020-10-05: qty 10

## 2020-10-05 MED ORDER — REGADENOSON 0.4 MG/5ML IV SOLN
INTRAVENOUS | Status: AC
  Administered 2020-10-05: 0.4 mg via INTRAVENOUS
  Filled 2020-10-05: qty 5

## 2020-10-05 MED ORDER — TECHNETIUM TC 99M TETROFOSMIN IV KIT
10.0000 | PACK | Freq: Once | INTRAVENOUS | Status: AC | PRN
  Administered 2020-10-05: 10.4 via INTRAVENOUS

## 2020-10-05 NOTE — Telephone Encounter (Signed)
Pt verbalized understanding and had no questions or concerns at this time.  

## 2020-10-05 NOTE — Telephone Encounter (Signed)
-----   Message from Wendall Stade, MD sent at 10/05/2020  4:29 PM EDT ----- Normal myovue

## 2020-10-05 NOTE — Telephone Encounter (Signed)
-----   Message from Wendall Stade, MD sent at 10/05/2020  4:30 PM EDT ----- Normal echo with good EF and no significant valvular heart disease

## 2020-10-05 NOTE — Progress Notes (Signed)
*  PRELIMINARY RESULTS* Echocardiogram 2D Echocardiogram has been performed.  Stacey Drain 10/05/2020, 10:33 AM

## 2020-10-18 ENCOUNTER — Ambulatory Visit: Payer: Medicare PPO | Admitting: Orthopaedic Surgery

## 2020-10-24 DIAGNOSIS — L97511 Non-pressure chronic ulcer of other part of right foot limited to breakdown of skin: Secondary | ICD-10-CM | POA: Diagnosis not present

## 2020-10-24 DIAGNOSIS — L84 Corns and callosities: Secondary | ICD-10-CM | POA: Diagnosis not present

## 2020-11-07 ENCOUNTER — Other Ambulatory Visit: Payer: Self-pay

## 2020-11-07 ENCOUNTER — Ambulatory Visit: Payer: Medicare PPO | Admitting: Dermatology

## 2020-11-07 ENCOUNTER — Encounter: Payer: Self-pay | Admitting: Dermatology

## 2020-11-07 DIAGNOSIS — Z1283 Encounter for screening for malignant neoplasm of skin: Secondary | ICD-10-CM

## 2020-11-07 DIAGNOSIS — L821 Other seborrheic keratosis: Secondary | ICD-10-CM | POA: Diagnosis not present

## 2020-11-18 ENCOUNTER — Encounter: Payer: Self-pay | Admitting: Dermatology

## 2020-11-18 NOTE — Progress Notes (Signed)
   Follow-Up Visit   Subjective  Whitney Gill is a 75 y.o. female who presents for the following: Annual Exam (No new concerns).  Annual skin examination, several spots on face that she would like to consider therapy. Location:  Duration:  Quality:  Associated Signs/Symptoms: Modifying Factors:  Severity:  Timing: Context:   Objective  Well appearing patient in no apparent distress; mood and affect are within normal limits. Head neck arms and upper chest and lower legs examined: No atypical pigmented lesions or nonmelanoma skin cancer.  Head Half dozen 3 to 11mm light brown textured papules    All sun exposed areas plus back examined.   Assessment & Plan    Seborrheic keratosis Head  Patient traveling this weekend defer treatment   Encounter for screening for malignant neoplasm of skin  Annual skin examination, encouraged to self examine twice annually.      I, Janalyn Harder, MD, have reviewed all documentation for this visit.  The documentation on 11/18/20 for the exam, diagnosis, procedures, and orders are all accurate and complete.

## 2020-12-07 DIAGNOSIS — M7752 Other enthesopathy of left foot: Secondary | ICD-10-CM | POA: Diagnosis not present

## 2020-12-07 DIAGNOSIS — M79672 Pain in left foot: Secondary | ICD-10-CM | POA: Diagnosis not present

## 2020-12-26 DIAGNOSIS — D649 Anemia, unspecified: Secondary | ICD-10-CM | POA: Diagnosis not present

## 2020-12-26 DIAGNOSIS — E559 Vitamin D deficiency, unspecified: Secondary | ICD-10-CM | POA: Diagnosis not present

## 2020-12-26 DIAGNOSIS — E039 Hypothyroidism, unspecified: Secondary | ICD-10-CM | POA: Diagnosis not present

## 2020-12-26 DIAGNOSIS — Z0001 Encounter for general adult medical examination with abnormal findings: Secondary | ICD-10-CM | POA: Diagnosis not present

## 2020-12-26 DIAGNOSIS — E785 Hyperlipidemia, unspecified: Secondary | ICD-10-CM | POA: Diagnosis not present

## 2020-12-26 DIAGNOSIS — E1169 Type 2 diabetes mellitus with other specified complication: Secondary | ICD-10-CM | POA: Diagnosis not present

## 2020-12-26 DIAGNOSIS — E7800 Pure hypercholesterolemia, unspecified: Secondary | ICD-10-CM | POA: Diagnosis not present

## 2020-12-26 DIAGNOSIS — I509 Heart failure, unspecified: Secondary | ICD-10-CM | POA: Diagnosis not present

## 2020-12-29 DIAGNOSIS — Z0001 Encounter for general adult medical examination with abnormal findings: Secondary | ICD-10-CM | POA: Diagnosis not present

## 2020-12-29 DIAGNOSIS — Z1331 Encounter for screening for depression: Secondary | ICD-10-CM | POA: Diagnosis not present

## 2020-12-29 DIAGNOSIS — E1165 Type 2 diabetes mellitus with hyperglycemia: Secondary | ICD-10-CM | POA: Diagnosis not present

## 2020-12-29 DIAGNOSIS — Z1389 Encounter for screening for other disorder: Secondary | ICD-10-CM | POA: Diagnosis not present

## 2020-12-29 DIAGNOSIS — I10 Essential (primary) hypertension: Secondary | ICD-10-CM | POA: Diagnosis not present

## 2020-12-29 DIAGNOSIS — E1140 Type 2 diabetes mellitus with diabetic neuropathy, unspecified: Secondary | ICD-10-CM | POA: Diagnosis not present

## 2020-12-29 DIAGNOSIS — E039 Hypothyroidism, unspecified: Secondary | ICD-10-CM | POA: Diagnosis not present

## 2020-12-29 DIAGNOSIS — H6120 Impacted cerumen, unspecified ear: Secondary | ICD-10-CM | POA: Diagnosis not present

## 2021-01-01 DIAGNOSIS — M810 Age-related osteoporosis without current pathological fracture: Secondary | ICD-10-CM | POA: Diagnosis not present

## 2021-01-01 DIAGNOSIS — E1165 Type 2 diabetes mellitus with hyperglycemia: Secondary | ICD-10-CM | POA: Diagnosis not present

## 2021-01-01 DIAGNOSIS — D649 Anemia, unspecified: Secondary | ICD-10-CM | POA: Diagnosis not present

## 2021-01-01 DIAGNOSIS — E785 Hyperlipidemia, unspecified: Secondary | ICD-10-CM | POA: Diagnosis not present

## 2021-01-01 DIAGNOSIS — I509 Heart failure, unspecified: Secondary | ICD-10-CM | POA: Diagnosis not present

## 2021-01-01 DIAGNOSIS — Z0001 Encounter for general adult medical examination with abnormal findings: Secondary | ICD-10-CM | POA: Diagnosis not present

## 2021-01-01 DIAGNOSIS — I10 Essential (primary) hypertension: Secondary | ICD-10-CM | POA: Diagnosis not present

## 2021-01-01 DIAGNOSIS — E039 Hypothyroidism, unspecified: Secondary | ICD-10-CM | POA: Diagnosis not present

## 2021-01-08 DIAGNOSIS — Z23 Encounter for immunization: Secondary | ICD-10-CM | POA: Diagnosis not present

## 2021-01-16 DIAGNOSIS — M10071 Idiopathic gout, right ankle and foot: Secondary | ICD-10-CM | POA: Diagnosis not present

## 2021-01-16 DIAGNOSIS — M79672 Pain in left foot: Secondary | ICD-10-CM | POA: Diagnosis not present

## 2021-01-16 DIAGNOSIS — L03121 Acute lymphangitis of right axilla: Secondary | ICD-10-CM | POA: Diagnosis not present

## 2021-01-23 DIAGNOSIS — M79672 Pain in left foot: Secondary | ICD-10-CM | POA: Diagnosis not present

## 2021-01-23 DIAGNOSIS — M79671 Pain in right foot: Secondary | ICD-10-CM | POA: Diagnosis not present

## 2021-01-23 DIAGNOSIS — L03121 Acute lymphangitis of right axilla: Secondary | ICD-10-CM | POA: Diagnosis not present

## 2021-02-27 DIAGNOSIS — B351 Tinea unguium: Secondary | ICD-10-CM | POA: Diagnosis not present

## 2021-02-27 DIAGNOSIS — M79676 Pain in unspecified toe(s): Secondary | ICD-10-CM | POA: Diagnosis not present

## 2021-02-27 DIAGNOSIS — L84 Corns and callosities: Secondary | ICD-10-CM | POA: Diagnosis not present

## 2021-02-27 DIAGNOSIS — E1142 Type 2 diabetes mellitus with diabetic polyneuropathy: Secondary | ICD-10-CM | POA: Diagnosis not present

## 2021-03-06 ENCOUNTER — Ambulatory Visit: Payer: Medicare PPO | Admitting: Dermatology

## 2021-03-12 ENCOUNTER — Ambulatory Visit: Payer: Medicare PPO | Admitting: Dermatology

## 2021-03-27 DIAGNOSIS — H40013 Open angle with borderline findings, low risk, bilateral: Secondary | ICD-10-CM | POA: Diagnosis not present

## 2021-03-27 DIAGNOSIS — H04123 Dry eye syndrome of bilateral lacrimal glands: Secondary | ICD-10-CM | POA: Diagnosis not present

## 2021-03-27 DIAGNOSIS — E119 Type 2 diabetes mellitus without complications: Secondary | ICD-10-CM | POA: Diagnosis not present

## 2021-03-27 DIAGNOSIS — Z961 Presence of intraocular lens: Secondary | ICD-10-CM | POA: Diagnosis not present

## 2021-04-03 DIAGNOSIS — M7661 Achilles tendinitis, right leg: Secondary | ICD-10-CM | POA: Diagnosis not present

## 2021-04-03 DIAGNOSIS — M79671 Pain in right foot: Secondary | ICD-10-CM | POA: Diagnosis not present

## 2021-04-30 DIAGNOSIS — H6123 Impacted cerumen, bilateral: Secondary | ICD-10-CM | POA: Diagnosis not present

## 2021-04-30 DIAGNOSIS — H903 Sensorineural hearing loss, bilateral: Secondary | ICD-10-CM | POA: Diagnosis not present

## 2021-05-22 DIAGNOSIS — L84 Corns and callosities: Secondary | ICD-10-CM | POA: Diagnosis not present

## 2021-05-22 DIAGNOSIS — B351 Tinea unguium: Secondary | ICD-10-CM | POA: Diagnosis not present

## 2021-05-22 DIAGNOSIS — M79676 Pain in unspecified toe(s): Secondary | ICD-10-CM | POA: Diagnosis not present

## 2021-05-22 DIAGNOSIS — E1142 Type 2 diabetes mellitus with diabetic polyneuropathy: Secondary | ICD-10-CM | POA: Diagnosis not present

## 2021-06-15 DIAGNOSIS — Z20828 Contact with and (suspected) exposure to other viral communicable diseases: Secondary | ICD-10-CM | POA: Diagnosis not present

## 2021-06-15 DIAGNOSIS — J209 Acute bronchitis, unspecified: Secondary | ICD-10-CM | POA: Diagnosis not present

## 2021-06-15 DIAGNOSIS — R059 Cough, unspecified: Secondary | ICD-10-CM | POA: Diagnosis not present

## 2021-06-19 DIAGNOSIS — Z6830 Body mass index (BMI) 30.0-30.9, adult: Secondary | ICD-10-CM | POA: Diagnosis not present

## 2021-06-19 DIAGNOSIS — J40 Bronchitis, not specified as acute or chronic: Secondary | ICD-10-CM | POA: Diagnosis not present

## 2021-06-19 DIAGNOSIS — J329 Chronic sinusitis, unspecified: Secondary | ICD-10-CM | POA: Diagnosis not present

## 2021-06-22 DIAGNOSIS — I10 Essential (primary) hypertension: Secondary | ICD-10-CM | POA: Diagnosis not present

## 2021-06-22 DIAGNOSIS — J329 Chronic sinusitis, unspecified: Secondary | ICD-10-CM | POA: Diagnosis not present

## 2021-06-22 DIAGNOSIS — J40 Bronchitis, not specified as acute or chronic: Secondary | ICD-10-CM | POA: Diagnosis not present

## 2021-06-22 DIAGNOSIS — Z6830 Body mass index (BMI) 30.0-30.9, adult: Secondary | ICD-10-CM | POA: Diagnosis not present

## 2021-06-26 DIAGNOSIS — Z1231 Encounter for screening mammogram for malignant neoplasm of breast: Secondary | ICD-10-CM | POA: Diagnosis not present

## 2021-07-05 DIAGNOSIS — N6489 Other specified disorders of breast: Secondary | ICD-10-CM | POA: Diagnosis not present

## 2021-07-05 DIAGNOSIS — M79676 Pain in unspecified toe(s): Secondary | ICD-10-CM | POA: Diagnosis not present

## 2021-07-05 DIAGNOSIS — E1142 Type 2 diabetes mellitus with diabetic polyneuropathy: Secondary | ICD-10-CM | POA: Diagnosis not present

## 2021-07-05 DIAGNOSIS — L84 Corns and callosities: Secondary | ICD-10-CM | POA: Diagnosis not present

## 2021-07-05 DIAGNOSIS — R922 Inconclusive mammogram: Secondary | ICD-10-CM | POA: Diagnosis not present

## 2021-07-05 DIAGNOSIS — B351 Tinea unguium: Secondary | ICD-10-CM | POA: Diagnosis not present

## 2021-07-05 DIAGNOSIS — R928 Other abnormal and inconclusive findings on diagnostic imaging of breast: Secondary | ICD-10-CM | POA: Diagnosis not present

## 2021-08-14 DIAGNOSIS — M79676 Pain in unspecified toe(s): Secondary | ICD-10-CM | POA: Diagnosis not present

## 2021-08-14 DIAGNOSIS — B351 Tinea unguium: Secondary | ICD-10-CM | POA: Diagnosis not present

## 2021-08-14 DIAGNOSIS — L84 Corns and callosities: Secondary | ICD-10-CM | POA: Diagnosis not present

## 2021-08-14 DIAGNOSIS — E1142 Type 2 diabetes mellitus with diabetic polyneuropathy: Secondary | ICD-10-CM | POA: Diagnosis not present

## 2021-09-18 DIAGNOSIS — L84 Corns and callosities: Secondary | ICD-10-CM | POA: Diagnosis not present

## 2021-09-18 DIAGNOSIS — M79676 Pain in unspecified toe(s): Secondary | ICD-10-CM | POA: Diagnosis not present

## 2021-09-18 DIAGNOSIS — B351 Tinea unguium: Secondary | ICD-10-CM | POA: Diagnosis not present

## 2021-09-18 DIAGNOSIS — E1142 Type 2 diabetes mellitus with diabetic polyneuropathy: Secondary | ICD-10-CM | POA: Diagnosis not present

## 2021-10-16 DIAGNOSIS — H40013 Open angle with borderline findings, low risk, bilateral: Secondary | ICD-10-CM | POA: Diagnosis not present

## 2021-10-16 DIAGNOSIS — H43812 Vitreous degeneration, left eye: Secondary | ICD-10-CM | POA: Diagnosis not present

## 2021-10-25 DIAGNOSIS — B351 Tinea unguium: Secondary | ICD-10-CM | POA: Diagnosis not present

## 2021-10-25 DIAGNOSIS — E1142 Type 2 diabetes mellitus with diabetic polyneuropathy: Secondary | ICD-10-CM | POA: Diagnosis not present

## 2021-10-25 DIAGNOSIS — M79676 Pain in unspecified toe(s): Secondary | ICD-10-CM | POA: Diagnosis not present

## 2021-10-25 DIAGNOSIS — L84 Corns and callosities: Secondary | ICD-10-CM | POA: Diagnosis not present

## 2021-11-12 ENCOUNTER — Ambulatory Visit: Payer: Medicare PPO | Admitting: Dermatology

## 2021-12-04 DIAGNOSIS — E1142 Type 2 diabetes mellitus with diabetic polyneuropathy: Secondary | ICD-10-CM | POA: Diagnosis not present

## 2021-12-04 DIAGNOSIS — L84 Corns and callosities: Secondary | ICD-10-CM | POA: Diagnosis not present

## 2021-12-04 DIAGNOSIS — M79676 Pain in unspecified toe(s): Secondary | ICD-10-CM | POA: Diagnosis not present

## 2021-12-04 DIAGNOSIS — B351 Tinea unguium: Secondary | ICD-10-CM | POA: Diagnosis not present

## 2021-12-06 DIAGNOSIS — H43812 Vitreous degeneration, left eye: Secondary | ICD-10-CM | POA: Diagnosis not present

## 2022-01-01 DIAGNOSIS — E1165 Type 2 diabetes mellitus with hyperglycemia: Secondary | ICD-10-CM | POA: Diagnosis not present

## 2022-01-01 DIAGNOSIS — I10 Essential (primary) hypertension: Secondary | ICD-10-CM | POA: Diagnosis not present

## 2022-01-01 DIAGNOSIS — E7800 Pure hypercholesterolemia, unspecified: Secondary | ICD-10-CM | POA: Diagnosis not present

## 2022-01-01 DIAGNOSIS — E559 Vitamin D deficiency, unspecified: Secondary | ICD-10-CM | POA: Diagnosis not present

## 2022-01-01 DIAGNOSIS — E1169 Type 2 diabetes mellitus with other specified complication: Secondary | ICD-10-CM | POA: Diagnosis not present

## 2022-01-01 DIAGNOSIS — E7801 Familial hypercholesterolemia: Secondary | ICD-10-CM | POA: Diagnosis not present

## 2022-01-01 DIAGNOSIS — E785 Hyperlipidemia, unspecified: Secondary | ICD-10-CM | POA: Diagnosis not present

## 2022-01-01 DIAGNOSIS — D519 Vitamin B12 deficiency anemia, unspecified: Secondary | ICD-10-CM | POA: Diagnosis not present

## 2022-01-01 DIAGNOSIS — E039 Hypothyroidism, unspecified: Secondary | ICD-10-CM | POA: Diagnosis not present

## 2022-01-04 DIAGNOSIS — M810 Age-related osteoporosis without current pathological fracture: Secondary | ICD-10-CM | POA: Diagnosis not present

## 2022-01-04 DIAGNOSIS — Z780 Asymptomatic menopausal state: Secondary | ICD-10-CM | POA: Diagnosis not present

## 2022-01-07 DIAGNOSIS — K219 Gastro-esophageal reflux disease without esophagitis: Secondary | ICD-10-CM | POA: Diagnosis not present

## 2022-01-07 DIAGNOSIS — E039 Hypothyroidism, unspecified: Secondary | ICD-10-CM | POA: Diagnosis not present

## 2022-01-07 DIAGNOSIS — E785 Hyperlipidemia, unspecified: Secondary | ICD-10-CM | POA: Diagnosis not present

## 2022-01-07 DIAGNOSIS — I509 Heart failure, unspecified: Secondary | ICD-10-CM | POA: Diagnosis not present

## 2022-01-07 DIAGNOSIS — M129 Arthropathy, unspecified: Secondary | ICD-10-CM | POA: Diagnosis not present

## 2022-01-07 DIAGNOSIS — D649 Anemia, unspecified: Secondary | ICD-10-CM | POA: Diagnosis not present

## 2022-01-07 DIAGNOSIS — I10 Essential (primary) hypertension: Secondary | ICD-10-CM | POA: Diagnosis not present

## 2022-01-07 DIAGNOSIS — M810 Age-related osteoporosis without current pathological fracture: Secondary | ICD-10-CM | POA: Diagnosis not present

## 2022-01-07 DIAGNOSIS — Z0001 Encounter for general adult medical examination with abnormal findings: Secondary | ICD-10-CM | POA: Diagnosis not present

## 2022-01-13 ENCOUNTER — Encounter (INDEPENDENT_AMBULATORY_CARE_PROVIDER_SITE_OTHER): Payer: Self-pay | Admitting: Gastroenterology

## 2022-03-07 DIAGNOSIS — L84 Corns and callosities: Secondary | ICD-10-CM | POA: Diagnosis not present

## 2022-03-07 DIAGNOSIS — B351 Tinea unguium: Secondary | ICD-10-CM | POA: Diagnosis not present

## 2022-03-07 DIAGNOSIS — M79676 Pain in unspecified toe(s): Secondary | ICD-10-CM | POA: Diagnosis not present

## 2022-03-07 DIAGNOSIS — E1142 Type 2 diabetes mellitus with diabetic polyneuropathy: Secondary | ICD-10-CM | POA: Diagnosis not present

## 2022-03-21 DIAGNOSIS — H04123 Dry eye syndrome of bilateral lacrimal glands: Secondary | ICD-10-CM | POA: Diagnosis not present

## 2022-03-21 DIAGNOSIS — H524 Presbyopia: Secondary | ICD-10-CM | POA: Diagnosis not present

## 2022-03-21 DIAGNOSIS — H40013 Open angle with borderline findings, low risk, bilateral: Secondary | ICD-10-CM | POA: Diagnosis not present

## 2022-03-21 DIAGNOSIS — E119 Type 2 diabetes mellitus without complications: Secondary | ICD-10-CM | POA: Diagnosis not present

## 2022-03-21 DIAGNOSIS — H35031 Hypertensive retinopathy, right eye: Secondary | ICD-10-CM | POA: Diagnosis not present

## 2022-04-09 DIAGNOSIS — B351 Tinea unguium: Secondary | ICD-10-CM | POA: Diagnosis not present

## 2022-04-09 DIAGNOSIS — E1142 Type 2 diabetes mellitus with diabetic polyneuropathy: Secondary | ICD-10-CM | POA: Diagnosis not present

## 2022-04-09 DIAGNOSIS — L84 Corns and callosities: Secondary | ICD-10-CM | POA: Diagnosis not present

## 2022-04-09 DIAGNOSIS — M79676 Pain in unspecified toe(s): Secondary | ICD-10-CM | POA: Diagnosis not present

## 2022-05-02 DIAGNOSIS — H6123 Impacted cerumen, bilateral: Secondary | ICD-10-CM | POA: Diagnosis not present

## 2022-05-02 DIAGNOSIS — H903 Sensorineural hearing loss, bilateral: Secondary | ICD-10-CM | POA: Diagnosis not present

## 2022-05-21 DIAGNOSIS — M79676 Pain in unspecified toe(s): Secondary | ICD-10-CM | POA: Diagnosis not present

## 2022-05-21 DIAGNOSIS — L84 Corns and callosities: Secondary | ICD-10-CM | POA: Diagnosis not present

## 2022-05-21 DIAGNOSIS — E1142 Type 2 diabetes mellitus with diabetic polyneuropathy: Secondary | ICD-10-CM | POA: Diagnosis not present

## 2022-05-21 DIAGNOSIS — L603 Nail dystrophy: Secondary | ICD-10-CM | POA: Diagnosis not present

## 2022-06-03 DIAGNOSIS — M19272 Secondary osteoarthritis, left ankle and foot: Secondary | ICD-10-CM | POA: Diagnosis not present

## 2022-06-03 DIAGNOSIS — M25572 Pain in left ankle and joints of left foot: Secondary | ICD-10-CM | POA: Diagnosis not present

## 2022-06-09 DIAGNOSIS — Z6830 Body mass index (BMI) 30.0-30.9, adult: Secondary | ICD-10-CM | POA: Diagnosis not present

## 2022-06-09 DIAGNOSIS — E669 Obesity, unspecified: Secondary | ICD-10-CM | POA: Diagnosis not present

## 2022-06-09 DIAGNOSIS — J209 Acute bronchitis, unspecified: Secondary | ICD-10-CM | POA: Diagnosis not present

## 2022-06-09 DIAGNOSIS — R051 Acute cough: Secondary | ICD-10-CM | POA: Diagnosis not present

## 2022-06-18 DIAGNOSIS — R059 Cough, unspecified: Secondary | ICD-10-CM | POA: Diagnosis not present

## 2022-06-18 DIAGNOSIS — J40 Bronchitis, not specified as acute or chronic: Secondary | ICD-10-CM | POA: Diagnosis not present

## 2022-06-18 DIAGNOSIS — E785 Hyperlipidemia, unspecified: Secondary | ICD-10-CM | POA: Diagnosis not present

## 2022-06-18 DIAGNOSIS — E1140 Type 2 diabetes mellitus with diabetic neuropathy, unspecified: Secondary | ICD-10-CM | POA: Diagnosis not present

## 2022-06-18 DIAGNOSIS — K219 Gastro-esophageal reflux disease without esophagitis: Secondary | ICD-10-CM | POA: Diagnosis not present

## 2022-06-18 DIAGNOSIS — E1165 Type 2 diabetes mellitus with hyperglycemia: Secondary | ICD-10-CM | POA: Diagnosis not present

## 2022-06-18 DIAGNOSIS — E7801 Familial hypercholesterolemia: Secondary | ICD-10-CM | POA: Diagnosis not present

## 2022-06-18 DIAGNOSIS — R030 Elevated blood-pressure reading, without diagnosis of hypertension: Secondary | ICD-10-CM | POA: Diagnosis not present

## 2022-06-20 DIAGNOSIS — Z1283 Encounter for screening for malignant neoplasm of skin: Secondary | ICD-10-CM | POA: Diagnosis not present

## 2022-06-20 DIAGNOSIS — Z85828 Personal history of other malignant neoplasm of skin: Secondary | ICD-10-CM | POA: Diagnosis not present

## 2022-06-20 DIAGNOSIS — Z08 Encounter for follow-up examination after completed treatment for malignant neoplasm: Secondary | ICD-10-CM | POA: Diagnosis not present

## 2022-06-20 DIAGNOSIS — L570 Actinic keratosis: Secondary | ICD-10-CM | POA: Diagnosis not present

## 2022-06-20 DIAGNOSIS — L568 Other specified acute skin changes due to ultraviolet radiation: Secondary | ICD-10-CM | POA: Diagnosis not present

## 2022-06-20 DIAGNOSIS — L821 Other seborrheic keratosis: Secondary | ICD-10-CM | POA: Diagnosis not present

## 2022-06-25 DIAGNOSIS — E1142 Type 2 diabetes mellitus with diabetic polyneuropathy: Secondary | ICD-10-CM | POA: Diagnosis not present

## 2022-06-25 DIAGNOSIS — M810 Age-related osteoporosis without current pathological fracture: Secondary | ICD-10-CM | POA: Diagnosis not present

## 2022-06-25 DIAGNOSIS — I10 Essential (primary) hypertension: Secondary | ICD-10-CM | POA: Diagnosis not present

## 2022-06-25 DIAGNOSIS — E039 Hypothyroidism, unspecified: Secondary | ICD-10-CM | POA: Diagnosis not present

## 2022-06-25 DIAGNOSIS — M79676 Pain in unspecified toe(s): Secondary | ICD-10-CM | POA: Diagnosis not present

## 2022-06-25 DIAGNOSIS — D649 Anemia, unspecified: Secondary | ICD-10-CM | POA: Diagnosis not present

## 2022-06-25 DIAGNOSIS — I509 Heart failure, unspecified: Secondary | ICD-10-CM | POA: Diagnosis not present

## 2022-06-25 DIAGNOSIS — L84 Corns and callosities: Secondary | ICD-10-CM | POA: Diagnosis not present

## 2022-06-25 DIAGNOSIS — E785 Hyperlipidemia, unspecified: Secondary | ICD-10-CM | POA: Diagnosis not present

## 2022-06-25 DIAGNOSIS — B351 Tinea unguium: Secondary | ICD-10-CM | POA: Diagnosis not present

## 2022-06-25 DIAGNOSIS — E1140 Type 2 diabetes mellitus with diabetic neuropathy, unspecified: Secondary | ICD-10-CM | POA: Diagnosis not present

## 2022-06-25 DIAGNOSIS — K219 Gastro-esophageal reflux disease without esophagitis: Secondary | ICD-10-CM | POA: Diagnosis not present

## 2022-06-25 DIAGNOSIS — M129 Arthropathy, unspecified: Secondary | ICD-10-CM | POA: Diagnosis not present

## 2022-07-11 DIAGNOSIS — Z1231 Encounter for screening mammogram for malignant neoplasm of breast: Secondary | ICD-10-CM | POA: Diagnosis not present

## 2022-07-16 DIAGNOSIS — M810 Age-related osteoporosis without current pathological fracture: Secondary | ICD-10-CM | POA: Diagnosis not present

## 2022-07-16 DIAGNOSIS — K219 Gastro-esophageal reflux disease without esophagitis: Secondary | ICD-10-CM | POA: Diagnosis not present

## 2022-07-16 DIAGNOSIS — M129 Arthropathy, unspecified: Secondary | ICD-10-CM | POA: Diagnosis not present

## 2022-07-16 DIAGNOSIS — I509 Heart failure, unspecified: Secondary | ICD-10-CM | POA: Diagnosis not present

## 2022-07-16 DIAGNOSIS — E785 Hyperlipidemia, unspecified: Secondary | ICD-10-CM | POA: Diagnosis not present

## 2022-07-16 DIAGNOSIS — E1140 Type 2 diabetes mellitus with diabetic neuropathy, unspecified: Secondary | ICD-10-CM | POA: Diagnosis not present

## 2022-07-16 DIAGNOSIS — D649 Anemia, unspecified: Secondary | ICD-10-CM | POA: Diagnosis not present

## 2022-07-16 DIAGNOSIS — I10 Essential (primary) hypertension: Secondary | ICD-10-CM | POA: Diagnosis not present

## 2022-07-16 DIAGNOSIS — E039 Hypothyroidism, unspecified: Secondary | ICD-10-CM | POA: Diagnosis not present

## 2022-07-24 DIAGNOSIS — H903 Sensorineural hearing loss, bilateral: Secondary | ICD-10-CM | POA: Diagnosis not present

## 2022-08-08 DIAGNOSIS — R030 Elevated blood-pressure reading, without diagnosis of hypertension: Secondary | ICD-10-CM | POA: Diagnosis not present

## 2022-08-08 DIAGNOSIS — M940 Chondrocostal junction syndrome [Tietze]: Secondary | ICD-10-CM | POA: Diagnosis not present

## 2022-08-08 DIAGNOSIS — Z6830 Body mass index (BMI) 30.0-30.9, adult: Secondary | ICD-10-CM | POA: Diagnosis not present

## 2022-08-08 DIAGNOSIS — J209 Acute bronchitis, unspecified: Secondary | ICD-10-CM | POA: Diagnosis not present

## 2022-08-13 DIAGNOSIS — L84 Corns and callosities: Secondary | ICD-10-CM | POA: Diagnosis not present

## 2022-08-13 DIAGNOSIS — B351 Tinea unguium: Secondary | ICD-10-CM | POA: Diagnosis not present

## 2022-08-13 DIAGNOSIS — E1142 Type 2 diabetes mellitus with diabetic polyneuropathy: Secondary | ICD-10-CM | POA: Diagnosis not present

## 2022-08-13 DIAGNOSIS — M79676 Pain in unspecified toe(s): Secondary | ICD-10-CM | POA: Diagnosis not present

## 2022-08-15 DIAGNOSIS — M810 Age-related osteoporosis without current pathological fracture: Secondary | ICD-10-CM | POA: Diagnosis not present

## 2022-08-15 DIAGNOSIS — E785 Hyperlipidemia, unspecified: Secondary | ICD-10-CM | POA: Diagnosis not present

## 2022-08-15 DIAGNOSIS — E1140 Type 2 diabetes mellitus with diabetic neuropathy, unspecified: Secondary | ICD-10-CM | POA: Diagnosis not present

## 2022-08-15 DIAGNOSIS — E1165 Type 2 diabetes mellitus with hyperglycemia: Secondary | ICD-10-CM | POA: Diagnosis not present

## 2022-08-15 DIAGNOSIS — I10 Essential (primary) hypertension: Secondary | ICD-10-CM | POA: Diagnosis not present

## 2022-08-15 DIAGNOSIS — Z6829 Body mass index (BMI) 29.0-29.9, adult: Secondary | ICD-10-CM | POA: Diagnosis not present

## 2022-08-15 DIAGNOSIS — K219 Gastro-esophageal reflux disease without esophagitis: Secondary | ICD-10-CM | POA: Diagnosis not present

## 2022-08-15 DIAGNOSIS — E039 Hypothyroidism, unspecified: Secondary | ICD-10-CM | POA: Diagnosis not present

## 2022-08-15 DIAGNOSIS — M129 Arthropathy, unspecified: Secondary | ICD-10-CM | POA: Diagnosis not present

## 2022-09-04 ENCOUNTER — Other Ambulatory Visit (INDEPENDENT_AMBULATORY_CARE_PROVIDER_SITE_OTHER): Payer: Medicare PPO

## 2022-09-04 ENCOUNTER — Ambulatory Visit: Payer: Medicare PPO | Admitting: Orthopaedic Surgery

## 2022-09-04 DIAGNOSIS — G8929 Other chronic pain: Secondary | ICD-10-CM | POA: Diagnosis not present

## 2022-09-04 DIAGNOSIS — M1612 Unilateral primary osteoarthritis, left hip: Secondary | ICD-10-CM

## 2022-09-04 DIAGNOSIS — M25562 Pain in left knee: Secondary | ICD-10-CM | POA: Diagnosis not present

## 2022-09-04 DIAGNOSIS — M25552 Pain in left hip: Secondary | ICD-10-CM

## 2022-09-04 DIAGNOSIS — M1712 Unilateral primary osteoarthritis, left knee: Secondary | ICD-10-CM

## 2022-09-04 DIAGNOSIS — M1711 Unilateral primary osteoarthritis, right knee: Secondary | ICD-10-CM | POA: Diagnosis not present

## 2022-09-04 DIAGNOSIS — M25561 Pain in right knee: Secondary | ICD-10-CM

## 2022-09-04 NOTE — Progress Notes (Signed)
The patient is well-known to me.  We replaced her right hip in January 2022.  She is 77 years old.  She reports bilateral knee pain and left hip and groin pain.  This has been slowly getting worse.  She is a diabetic and is trying to work on better blood glucose control.  She has a follow-up in September with her primary care physician.  Her blood glucose is still been running high.  On exam most of her severe pain is with her left hip.  There is pain over the trochanteric area and especially in the groin with significant decrease in rotation of that hip and severe pain with internal and external rotation of the left hip.  Examination of both knees shows no malalignment but significant patellofemoral capitation and global tenderness of both knees.  Both knees are ligamentously stable.  She does walk slightly hunched over her gait as well in terms of her spine.  We talked about standing up straight as possible.  An AP pelvis and lateral left hip shows severe end-stage arthritis of the left hip with joint space narrowing and osteophytes around the hip.  X-rays of both knees show tricompartment arthritis with joint space narrowing and loss of space more on the right than the left but both knees have significant patellofemoral arthritic changes as well.  At this point we talked about the potential for a hip replacement with her left hip because I think that is bothering her the most.  The right hip replacement is done well.  However we do need to have her continue to watch her blood glucose closely.  We will see her back in 3 months and by then she will have seen her primary care physician and will be continue to work on blood glucose control.  We can see at some point about scheduling her for a left hip replacement.  No x-rays are needed at her next visit.

## 2022-10-08 DIAGNOSIS — E1142 Type 2 diabetes mellitus with diabetic polyneuropathy: Secondary | ICD-10-CM | POA: Diagnosis not present

## 2022-10-08 DIAGNOSIS — B351 Tinea unguium: Secondary | ICD-10-CM | POA: Diagnosis not present

## 2022-10-08 DIAGNOSIS — M79676 Pain in unspecified toe(s): Secondary | ICD-10-CM | POA: Diagnosis not present

## 2022-10-08 DIAGNOSIS — L84 Corns and callosities: Secondary | ICD-10-CM | POA: Diagnosis not present

## 2022-11-11 DIAGNOSIS — E785 Hyperlipidemia, unspecified: Secondary | ICD-10-CM | POA: Diagnosis not present

## 2022-11-11 DIAGNOSIS — E1140 Type 2 diabetes mellitus with diabetic neuropathy, unspecified: Secondary | ICD-10-CM | POA: Diagnosis not present

## 2022-11-11 DIAGNOSIS — M810 Age-related osteoporosis without current pathological fracture: Secondary | ICD-10-CM | POA: Diagnosis not present

## 2022-11-11 DIAGNOSIS — I10 Essential (primary) hypertension: Secondary | ICD-10-CM | POA: Diagnosis not present

## 2022-11-11 DIAGNOSIS — M129 Arthropathy, unspecified: Secondary | ICD-10-CM | POA: Diagnosis not present

## 2022-11-11 DIAGNOSIS — K219 Gastro-esophageal reflux disease without esophagitis: Secondary | ICD-10-CM | POA: Diagnosis not present

## 2022-11-11 DIAGNOSIS — E1165 Type 2 diabetes mellitus with hyperglycemia: Secondary | ICD-10-CM | POA: Diagnosis not present

## 2022-11-11 DIAGNOSIS — Z6830 Body mass index (BMI) 30.0-30.9, adult: Secondary | ICD-10-CM | POA: Diagnosis not present

## 2022-11-11 DIAGNOSIS — E039 Hypothyroidism, unspecified: Secondary | ICD-10-CM | POA: Diagnosis not present

## 2022-11-25 DIAGNOSIS — E1140 Type 2 diabetes mellitus with diabetic neuropathy, unspecified: Secondary | ICD-10-CM | POA: Diagnosis not present

## 2022-11-25 DIAGNOSIS — I10 Essential (primary) hypertension: Secondary | ICD-10-CM | POA: Diagnosis not present

## 2022-11-25 DIAGNOSIS — E785 Hyperlipidemia, unspecified: Secondary | ICD-10-CM | POA: Diagnosis not present

## 2022-12-03 DIAGNOSIS — B351 Tinea unguium: Secondary | ICD-10-CM | POA: Diagnosis not present

## 2022-12-03 DIAGNOSIS — L84 Corns and callosities: Secondary | ICD-10-CM | POA: Diagnosis not present

## 2022-12-03 DIAGNOSIS — E1140 Type 2 diabetes mellitus with diabetic neuropathy, unspecified: Secondary | ICD-10-CM | POA: Diagnosis not present

## 2022-12-03 DIAGNOSIS — M79676 Pain in unspecified toe(s): Secondary | ICD-10-CM | POA: Diagnosis not present

## 2022-12-03 DIAGNOSIS — E1142 Type 2 diabetes mellitus with diabetic polyneuropathy: Secondary | ICD-10-CM | POA: Diagnosis not present

## 2022-12-05 ENCOUNTER — Other Ambulatory Visit: Payer: Self-pay

## 2022-12-05 NOTE — Progress Notes (Signed)
Sent message, via epic in basket, requesting orders in epic from surgeon.  

## 2022-12-06 DIAGNOSIS — Z6828 Body mass index (BMI) 28.0-28.9, adult: Secondary | ICD-10-CM | POA: Diagnosis not present

## 2022-12-06 DIAGNOSIS — M810 Age-related osteoporosis without current pathological fracture: Secondary | ICD-10-CM | POA: Diagnosis not present

## 2022-12-06 DIAGNOSIS — E1165 Type 2 diabetes mellitus with hyperglycemia: Secondary | ICD-10-CM | POA: Diagnosis not present

## 2022-12-06 DIAGNOSIS — M129 Arthropathy, unspecified: Secondary | ICD-10-CM | POA: Diagnosis not present

## 2022-12-06 DIAGNOSIS — K219 Gastro-esophageal reflux disease without esophagitis: Secondary | ICD-10-CM | POA: Diagnosis not present

## 2022-12-06 DIAGNOSIS — E039 Hypothyroidism, unspecified: Secondary | ICD-10-CM | POA: Diagnosis not present

## 2022-12-06 DIAGNOSIS — E785 Hyperlipidemia, unspecified: Secondary | ICD-10-CM | POA: Diagnosis not present

## 2022-12-06 DIAGNOSIS — E1140 Type 2 diabetes mellitus with diabetic neuropathy, unspecified: Secondary | ICD-10-CM | POA: Diagnosis not present

## 2022-12-06 DIAGNOSIS — I10 Essential (primary) hypertension: Secondary | ICD-10-CM | POA: Diagnosis not present

## 2022-12-08 NOTE — Progress Notes (Addendum)
THIS PATIENT WILL NEED A CBC and CMP drawn on DOS. Lab threw them away and a SZP will be done. Alphonzo Lemmings is aware of this.  COVID Vaccine received:  []  No [x]  Yes Date of any COVID positive Test in last 90 days:  None  PCP - Mitzi Hansen, MD  Cardiologist - saw Charlton Haws, MD (LOV  09-20-2020)  Chest x-ray -  EKG -  02-29-2020  Epic    will repeat at PST Stress Test - 10-05-2020  Epic ECHO - 10-05-2020  Epic Cardiac Cath -   PCR screen: [x]  Ordered & Completed []   No Order but Needs PROFEND     []   N/A for this surgery  Surgery Plan:  []  Ambulatory   [x]  Outpatient in bed  []  Admit Anesthesia:    []  General  [x]  Spinal  []   Choice []   MAC  Pacemaker / ICD device [x]  No []  Yes   Spinal Cord Stimulator:[x]  No []  Yes       History of Sleep Apnea? []  No [x]  Yes   negative study CPAP used?- [x]  No []  Yes    Does the patient monitor blood sugar?   []  N/A   []  No [x]  Yes  Patient has: []  NO Hx DM   [x]  Pre-DM   []  DM1  []   DM2 Last A1c was:  5.6  on  12-03-2022   on chart Does patient have a [employer] or Dexacom? []  No [x]  Yes   Reading on Dexacom was 97 at PST Checks Blood Sugar  Continuous_ times a day   Diabetic medications/ instructions:  Glipizide- 10 mg q am-  Day Before:  Take usual dose.  Hold DOS Metformin:  500 mg bid-  Day Before:  take usual dose.  Hold DOS  Blood Thinner / Instructions:  None Aspirin Instructions:  none  ERAS Protocol Ordered: []  No  [x]  Yes PRE-SURGERY []  ENSURE  [x]  G2  Patient is to be NPO after: 0700  Dental hx: []  Dentures:  [x]  N/A      []  Bridge or Partial:                   []  Loose or Damaged teeth:   Comments: Patient was given the 5 CHG shower / bath instructions for THA surgery along with 2 bottles of the CHG soap. Patient will start this on: Monday  12-16-2022  All questions were asked and answered, Patient voiced understanding of this process.   Activity level: Patient is unable to climb a flight of stairs without difficulty;  [x]  No CP  [x]  No SOB, but would have Leg pain,  Patient can perform ADLs without assistance.   Anesthesia review: HTN, DM2, GERD  Patient denies shortness of breath, fever, cough and chest pain at PAT appointment.  Patient verbalized understanding and agreement to the Pre-Surgical Instructions that were given to them at this PAT appointment. Patient was also educated of the need to review these PAT instructions again prior to her surgery.I reviewed the appropriate phone numbers to call if they have any and questions or concerns.

## 2022-12-08 NOTE — Patient Instructions (Signed)
SURGICAL WAITING ROOM VISITATION Patients having surgery or a procedure may have no more than 2 support people in the waiting area - these visitors may rotate in the visitor waiting room.   Due to an increase in RSV and influenza rates and associated hospitalizations, children ages 52 and under may not visit patients in [facility] hospitals. If the patient needs to stay at the hospital during part of their recovery, the visitor guidelines for inpatient rooms apply.  PRE-OP VISITATION  Pre-op nurse will coordinate an appropriate time for 1 support person to accompany the patient in pre-op.  This support person may not rotate.  This visitor will be contacted when the time is appropriate for the visitor to come back in the pre-op area.  Please refer to the [facility] website for the visitor guidelines for Inpatients (after your surgery is over and you are in a regular room).  You are not required to quarantine at this time prior to your surgery. However, you must do this: Hand Hygiene often Do NOT share personal items Notify your provider if you are in close contact with someone who has COVID or you develop fever 100.4 or greater, new onset of sneezing, cough, sore throat, shortness of breath or body aches.  If you test positive for Covid or have been in contact with anyone that has tested positive in the last 10 days please notify you surgeon.    Your procedure is scheduled on:  FRIDAY  December 20, 2022  Report to [facility] Main Entrance: [facility] entrance where the [employer] is available.   Report to admitting at: 07:30    AM  Call this number if you have any questions or problems the morning of surgery [phone]  Do not eat food after Midnight the night prior to your surgery/procedure.  After Midnight you may have the following liquids until 07:00 AM DAY OF SURGERY  Clear Liquid Diet Water Black Coffee (sugar ok, NO MILK/CREAM OR CREAMERS)  Tea (sugar ok, NO  MILK/CREAM OR CREAMERS) regular and decaf                             Plain Jell-O  with no fruit (NO RED)                                           Fruit ices (not with fruit pulp, NO RED)                                     Popsicles (NO RED)                                                                  Juice: NO CITRUS JUICES: only apple, WHITE grape, WHITE cranberry Sports drinks like Gatorade or Powerade (NO RED)                    The day of surgery:  Drink ONE (1) Pre-Surgery G2 at 07:00 AM the morning of surgery. Drink in one  sitting. Do not sip.  This drink was given to you during your hospital pre-op appointment visit. Nothing else to drink after completing the Pre-Surgery  G2 : No candy, chewing gum or throat lozenges.    FOLLOW ANY ADDITIONAL PRE OP INSTRUCTIONS YOU RECEIVED FROM YOUR SURGEON'S OFFICE!!!   Oral Hygiene is also important to reduce your risk of infection.        Remember - BRUSH YOUR TEETH THE MORNING OF SURGERY WITH YOUR REGULAR TOOTHPASTE  Do NOT smoke after Midnight the night before surgery.  STOP TAKING all Vitamins, Herbs and supplements 1 week before your surgery.   Take ONLY these medicines the morning of surgery with A SIP OF WATER: Levothyroxine (Synthroid), and you may take Tylenol if needed for pain.  You may use your Albuterol inhaler, Flonase nasal spray and your Eye Drops if needed.   Diabetic medications/ instructions:  Glipizide- 10 mg q am-  Day Before:  Take usual dose.  DON'T TAKE THE DAY OF SURGERY Metformin:  500 mg bid-  Day Before:  take usual dose.  DON'T TAKE THE DAY OF SURGERY                 You may not have any metal on your body including hair pins, jewelry, and body piercing  Do not wear make-up, lotions, powders, perfumes or deodorant  Do not wear nail polish including gel and S&S, artificial / acrylic nails, or any other type of covering on natural nails including finger and toenails. If you have artificial nails, gel  coating, etc., that needs to be removed by a nail salon, Please have this removed prior to surgery. Not doing so may mean that your surgery could be cancelled or delayed if the Surgeon or anesthesia staff feels like they are unable to monitor you safely.   Do not shave 48 hours prior to surgery to avoid nicks in your skin which may contribute to postoperative infections.   Contacts, Hearing Aids, dentures or bridgework may not be worn into surgery. DENTURES WILL BE REMOVED PRIOR TO SURGERY PLEASE DO NOT APPLY "Poly grip" OR ADHESIVES!!!  You may bring a small overnight bag with you on the day of surgery, only pack items that are not valuable. [facility] IS NOT RESPONSIBLE   FOR VALUABLES THAT ARE LOST OR STOLEN.   Do not bring your home medications to the hospital. The Pharmacy will dispense medications listed on your medication list to you during your admission in the Hospital.  Special Instructions: Bring a copy of your healthcare power of attorney and living will documents the day of surgery, if you wish to have them scanned into your [facility]- EPIC  Please read over the following fact sheets you were given: IF YOU HAVE QUESTIONS ABOUT YOUR PRE-OP INSTRUCTIONS, PLEASE CALL [phone]      Pre-operative 5 CHG Bath Instructions   You can play a key role in reducing the risk of infection after surgery. Your skin needs to be as free of germs as possible. You can reduce the number of germs on your skin by washing with CHG (chlorhexidine gluconate) soap before surgery. CHG is an antiseptic soap that kills germs and continues to kill germs even after washing.   DO NOT use if you have an allergy to chlorhexidine/CHG or antibacterial soaps. If your skin becomes reddened or irritated, stop using the CHG and notify one of our RNs at [phone]  Please shower with the CHG soap starting 4  days before surgery using the following schedule: START SHOWERS ON   MONDAY   December 16, 2022                                                                                                                                                                                      Please keep in mind the following:  DO NOT shave, including legs and underarms, starting the day of your first shower.   You may shave your face at any point before/day of surgery.   Place clean sheets on your bed the day you start using CHG soap. Use a clean washcloth (not used since being washed) for each shower. DO NOT sleep with pets once you start using the CHG.   CHG Shower Instructions:  If you choose to wash your hair and private area, wash first with your normal shampoo/soap.  After you use shampoo/soap, rinse your hair and body thoroughly to remove shampoo/soap residue.  Turn the water OFF and apply about 3 tablespoons (45 ml) of CHG soap to a CLEAN washcloth.  Apply CHG soap ONLY FROM YOUR NECK DOWN TO YOUR TOES (washing for 3-5 minutes)  DO NOT use CHG soap on face, private areas, open wounds, or sores.  Pay special attention to the area where your surgery is being performed.  If you are having back surgery, having someone wash your back for you may be helpful.  Wait 2 minutes after CHG soap is applied, then you may rinse off the CHG soap.  Pat dry with a clean towel  Put on clean clothes/pajamas   If you choose to wear lotion, please use ONLY the CHG-compatible lotions on the back of this paper.     Additional instructions for the day of surgery: DO NOT APPLY any lotions, deodorants, cologne, or perfumes.   Put on clean/comfortable clothes.  Brush your teeth.  Ask your nurse before applying any prescription medications to the skin.      CHG Compatible Lotions   Aveeno Moisturizing lotion  Cetaphil Moisturizing Cream  Cetaphil Moisturizing Lotion  Clairol Herbal Essence Moisturizing Lotion, Dry Skin  Clairol Herbal Essence Moisturizing Lotion, Extra Dry Skin  Clairol Herbal  Essence Moisturizing Lotion, Normal Skin  Curel Age Defying Therapeutic Moisturizing Lotion with Alpha Hydroxy  Curel Extreme Care Body Lotion  Curel Soothing Hands Moisturizing Hand Lotion  Curel Therapeutic Moisturizing Cream, Fragrance-Free  Curel Therapeutic Moisturizing Lotion, Fragrance-Free  Curel Therapeutic Moisturizing Lotion, Original Formula  Eucerin Daily Replenishing Lotion  Eucerin Dry Skin Therapy Plus Alpha Hydroxy Crme  Eucerin Dry Skin Therapy Plus Alpha Hydroxy Lotion  Eucerin Original Crme  Eucerin Original Lotion  Eucerin Plus Crme Eucerin Plus Lotion  Eucerin TriLipid Replenishing Lotion  Keri Anti-Bacterial Hand Lotion  Keri Deep Conditioning Original Lotion Dry Skin Formula Softly Scented  Keri Deep Conditioning Original Lotion, Fragrance Free Sensitive Skin Formula  Keri Lotion Fast Absorbing Fragrance Free Sensitive Skin Formula  Keri Lotion Fast Absorbing Softly Scented Dry Skin Formula  Keri Original Lotion  Keri Skin Renewal Lotion Keri Silky Smooth Lotion  Keri Silky Smooth Sensitive Skin Lotion  Nivea Body Creamy Conditioning Oil  Nivea Body Extra Enriched Lotion  Nivea Body Original Lotion  Nivea Body Sheer Moisturizing Lotion Nivea Crme  Nivea Skin Firming Lotion  NutraDerm 30 Skin Lotion  NutraDerm Skin Lotion  NutraDerm Therapeutic Skin Cream  NutraDerm Therapeutic Skin Lotion  ProShield Protective Hand Cream  Provon moisturizing lotion   FAILURE TO FOLLOW THESE INSTRUCTIONS MAY RESULT IN THE CANCELLATION OF YOUR SURGERY  PATIENT SIGNATURE_________________________________  NURSE SIGNATURE__________________________________  ________________________________________________________________________      Whitney Gill    An incentive spirometer is a tool that can help keep your lungs clear and active. This tool measures how well you are filling your lungs with each breath. Taking long deep breaths may help reverse or decrease  the chance of developing breathing (pulmonary) problems (especially infection) following: A long period of time when you are unable to move or be active. BEFORE THE PROCEDURE  If the spirometer includes an indicator to show your best effort, your nurse or respiratory therapist will set it to a desired goal. If possible, sit up straight or lean slightly forward. Try not to slouch. Hold the incentive spirometer in an upright position. INSTRUCTIONS FOR USE  Sit on the edge of your bed if possible, or sit up as far as you can in bed or on a chair. Hold the incentive spirometer in an upright position. Breathe out normally. Place the mouthpiece in your mouth and seal your lips tightly around it. Breathe in slowly and as deeply as possible, raising the piston or the ball toward the top of the column. Hold your breath for 3-5 seconds or for as long as possible. Allow the piston or ball to fall to the bottom of the column. Remove the mouthpiece from your mouth and breathe out normally. Rest for a few seconds and repeat Steps 1 through 7 at least 10 times every 1-2 hours when you are awake. Take your time and take a few normal breaths between deep breaths. The spirometer may include an indicator to show your best effort. Use the indicator as a goal to work toward during each repetition. After each set of 10 deep breaths, practice coughing to be sure your lungs are clear. If you have an incision (the cut made at the time of surgery), support your incision when coughing by placing a pillow or rolled up towels firmly against it. Once you are able to get out of bed, walk around indoors and cough well. You may stop using the incentive spirometer when instructed by your caregiver.  RISKS AND COMPLICATIONS Take your time so you do not get dizzy or light-headed. If you are in pain, you may need to take or ask for pain medication before doing incentive spirometry. It is harder to take a deep breath if you are  having pain. AFTER USE Rest and breathe slowly and easily. It can be helpful to keep track of a log of your progress. Your caregiver can provide you with a simple table to help with this. If you are using  the spirometer at home, follow these instructions: SEEK MEDICAL CARE IF:  You are having difficultly using the spirometer. You have trouble using the spirometer as often as instructed. Your pain medication is not giving enough relief while using the spirometer. You develop fever of 100.5 F (38.1 C) or higher.                                                                                                    SEEK IMMEDIATE MEDICAL CARE IF:  You cough up bloody sputum that had not been present before. You develop fever of 102 F (38.9 C) or greater. You develop worsening pain at or near the incision site. MAKE SURE YOU:  Understand these instructions. Will watch your condition. Will get help right away if you are not doing well or get worse. Document Released: 06/24/2006 Document Revised: 05/06/2011 Document Reviewed: 08/25/2006 [facility] Patient Information 2014 [place], [place].      WHAT IS A BLOOD TRANSFUSION? Blood Transfusion Information  A transfusion is the replacement of blood or some of its parts. Blood is made up of multiple cells which provide different functions. Red blood cells carry oxygen and are used for blood loss replacement. White blood cells fight against infection. Platelets control bleeding. Plasma helps clot blood. Other blood products are available for specialized needs, such as hemophilia or other clotting disorders. BEFORE THE TRANSFUSION  Who gives blood for transfusions?  Healthy volunteers who are fully evaluated to make sure their blood is safe. This is blood bank blood. Transfusion therapy is the safest it has ever been in the practice of medicine. Before blood is taken from a donor, a complete history is taken to make sure that person has no history  of diseases nor engages in risky social behavior (examples are intravenous drug use or sexual activity with multiple partners). The donor's travel history is screened to minimize risk of transmitting infections, such as malaria. The donated blood is tested for signs of infectious diseases, such as HIV and hepatitis. The blood is then tested to be sure it is compatible with you in order to minimize the chance of a transfusion reaction. If you or a relative donates blood, this is often done in anticipation of surgery and is not appropriate for emergency situations. It takes many days to process the donated blood. RISKS AND COMPLICATIONS Although transfusion therapy is very safe and saves many lives, the main dangers of transfusion include:  Getting an infectious disease. Developing a transfusion reaction. This is an allergic reaction to something in the blood you were given. Every precaution is taken to prevent this. The decision to have a blood transfusion has been considered carefully by your caregiver before blood is given. Blood is not given unless the benefits outweigh the risks. AFTER THE TRANSFUSION Right after receiving a blood transfusion, you will usually feel much better and more energetic. This is especially true if your red blood cells have gotten low (anemic). The transfusion raises the level of the red blood cells which carry oxygen, and this usually causes an energy increase. The nurse administering  the transfusion will monitor you carefully for complications. HOME CARE INSTRUCTIONS  No special instructions are needed after a transfusion. You may find your energy is better. Speak with your caregiver about any limitations on activity for underlying diseases you may have. SEEK MEDICAL CARE IF:  Your condition is not improving after your transfusion. You develop redness or irritation at the intravenous (IV) site. SEEK IMMEDIATE MEDICAL CARE IF:  Any of the following symptoms occur over the  next 12 hours: Shaking chills. You have a temperature by mouth above 102 F (38.9 C), not controlled by medicine. Chest, back, or muscle pain. People around you feel you are not acting correctly or are confused. Shortness of breath or difficulty breathing. Dizziness and fainting. You get a rash or develop hives. You have a decrease in urine output. Your urine turns a dark color or changes to pink, red, or brown. Any of the following symptoms occur over the next 10 days: You have a temperature by mouth above 102 F (38.9 C), not controlled by medicine. Shortness of breath. Weakness after normal activity. The white part of the eye turns yellow (jaundice). You have a decrease in the amount of urine or are urinating less often. Your urine turns a dark color or changes to pink, red, or brown. Document Released: 02/09/2000 Document Revised: 05/06/2011 Document Reviewed: 09/28/2007 [facility] Patient Information 2014 [place], [place].  _______________________________________________________________________

## 2022-12-09 ENCOUNTER — Encounter: Payer: Self-pay | Admitting: Orthopaedic Surgery

## 2022-12-09 ENCOUNTER — Ambulatory Visit (INDEPENDENT_AMBULATORY_CARE_PROVIDER_SITE_OTHER): Payer: Self-pay | Admitting: Audiology

## 2022-12-09 ENCOUNTER — Other Ambulatory Visit: Payer: Self-pay

## 2022-12-09 ENCOUNTER — Encounter (HOSPITAL_COMMUNITY): Payer: Self-pay

## 2022-12-09 ENCOUNTER — Encounter (HOSPITAL_COMMUNITY)
Admission: RE | Admit: 2022-12-09 | Discharge: 2022-12-09 | Disposition: A | Discharge status: Home / Self Care | Payer: Medicare PPO | Source: Ambulatory Visit | Attending: Orthopaedic Surgery

## 2022-12-09 ENCOUNTER — Ambulatory Visit: Payer: Medicare PPO | Admitting: Orthopaedic Surgery

## 2022-12-09 VITALS — BP 127/63 | HR 80 | Temp 98.9°F | Resp 16 | Ht 66.0 in | Wt 172.0 lb

## 2022-12-09 DIAGNOSIS — I10 Essential (primary) hypertension: Secondary | ICD-10-CM | POA: Insufficient documentation

## 2022-12-09 DIAGNOSIS — M1612 Unilateral primary osteoarthritis, left hip: Secondary | ICD-10-CM | POA: Diagnosis not present

## 2022-12-09 DIAGNOSIS — Z01818 Encounter for other preprocedural examination: Secondary | ICD-10-CM | POA: Insufficient documentation

## 2022-12-09 DIAGNOSIS — E119 Type 2 diabetes mellitus without complications: Secondary | ICD-10-CM | POA: Insufficient documentation

## 2022-12-09 DIAGNOSIS — H903 Sensorineural hearing loss, bilateral: Secondary | ICD-10-CM

## 2022-12-09 HISTORY — DX: Pneumonia, unspecified organism: J18.9

## 2022-12-09 HISTORY — DX: Malignant (primary) neoplasm, unspecified: C80.1

## 2022-12-09 HISTORY — DX: Anxiety disorder, unspecified: F41.9

## 2022-12-09 LAB — SURGICAL PCR SCREEN
MRSA, PCR: NEGATIVE
Staphylococcus aureus: NEGATIVE

## 2022-12-09 LAB — TYPE AND SCREEN
ABO/RH(D): O POS
Antibody Screen: NEGATIVE

## 2022-12-09 NOTE — Progress Notes (Signed)
The patient is scheduled for a total hip arthroplasty on Friday, October 25.  She comes in today just for another visit to make sure she is doing well and ready for that surgery.  She has been cleared for surgery by her primary care physician and is seeing cardiology as well.  Her last hemoglobin A1c was below 6.  She has lost weight as well.  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  I was able to review all of her notes from her prior care physician as well as her labs.  We reviewed everything within epic in terms of past medical history and medications.  On exam her left hip has severe pain with internal and external rotation and significant stiffness with rotation.  Her right operative hip moves smoothly.  Again we have her set up already for surgery for October 25 which is the Friday after next.  We will see her at the operating room then.  All questions and concerns were addressed and answered.

## 2022-12-09 NOTE — Progress Notes (Signed)
Patient was seen today for a hearing aid check. Cleaned both devices, replaced the wax filters, domes, and retention lines. She noticed an improvement in the sound quality and the feel in her ears. Connected the hearing aids to the software and ran a firmware update. She denied needing any further software changes. Reconnected the hearing aids to her app and discussed the features. She was happy with the changes today. Instructed her to call if any concerns arise.  Whitney Gill, AUD, CCC-A 12/09/22

## 2022-12-19 ENCOUNTER — Telehealth: Payer: Self-pay | Admitting: *Deleted

## 2022-12-19 NOTE — Telephone Encounter (Signed)
[facility]o [facility] pre-op call completed on 12/09/22 during office meeting.

## 2022-12-19 NOTE — Care Plan (Signed)
[facility] RNCM met with patient and her husband in office to discuss her upcoming Left total hip arthroplasty with Dr. Magnus Ivan on 12/20/22 at [facility]. She is agreeable to case management. She lives with her spouse and plan is to return home with assistance from him and family. She has a RW already. Anticipate HHPT will be needed after a short hospital stay. Referral made to [facility] after choice provided. Reviewed all post op care instructions and questions answered. Will continue to follow for needs.

## 2022-12-19 NOTE — H&P (Signed)
TOTAL HIP ADMISSION H&P  Patient is admitted for left total hip arthroplasty.  Subjective:  Chief Complaint: left hip pain  HPI: Whitney Gill, 77 y.o. female, has a history of pain and functional disability in the left hip(s) due to arthritis and patient has failed non-surgical conservative treatments for greater than 12 weeks to include NSAID's and/or analgesics, flexibility and strengthening excercises, use of assistive devices, and activity modification.  Onset of symptoms was gradual starting 1 years ago with gradually worsening course since that time.The patient noted no past surgery on the left hip(s).  Patient currently rates pain in the left hip at 10 out of 10 with activity. Patient has night pain, worsening of pain with activity and weight bearing, trendelenberg gait, pain that interfers with activities of daily living, and pain with passive range of motion. Patient has evidence of subchondral cysts, subchondral sclerosis, periarticular osteophytes, and joint space narrowing by imaging studies. This condition presents safety issues increasing the risk of falls.  There is no current active infection.  Patient Active Problem List   Diagnosis Date Noted   Unilateral primary osteoarthritis, left hip 09/04/2022   Unilateral primary osteoarthritis, right knee 08/23/2020   Status post total hip replacement, right 03/03/2020   Special screening for malignant neoplasms, colon 08/05/2018   Cough 08/05/2018   GERD (gastroesophageal reflux disease) 08/12/2014   Essential hypertension 08/12/2014   Upper airway cough syndrome 08/11/2014   Past Medical History:  Diagnosis Date   Anxiety    Arthritis    hip, knee   Asthma    Cancer (HCC)    skin cancer on top of head   Diabetes mellitus without complication (HCC)    Hypertension    Hypothyroidism    Pneumonia     Past Surgical History:  Procedure Laterality Date   BREAST BIOPSY     COLONOSCOPY N/A 11/25/2018   Procedure:  COLONOSCOPY;  Surgeon: Malissa Hippo, MD;  Location: [facility];  Service: Endoscopy;  Laterality: N/A;  1030   ESOPHAGOGASTRODUODENOSCOPY N/A 11/25/2018   Procedure: ESOPHAGOGASTRODUODENOSCOPY (EGD);  Surgeon: Malissa Hippo, MD;  Location: [facility];  Service: Endoscopy;  Laterality: N/A;   KNEE ARTHROSCOPY Right 1985   L ankle surgery Left 2007,2010,2011   SHOULDER SURGERY Right 1998   TONSILLECTOMY     TOTAL HIP ARTHROPLASTY Right 03/03/2020   Procedure: RIGHT TOTAL HIP ARTHROPLASTY ANTERIOR APPROACH;  Surgeon: Kathryne Hitch, MD;  Location: [facility];  Service: Orthopedics;  Laterality: Right;    No current facility-administered medications for this encounter.   Current Outpatient Medications  Medication Sig Dispense Refill Last Dose   acetaminophen (TYLENOL) 650 MG CR tablet Take 650-1,300 mg by mouth every 8 (eight) hours as needed for pain.      albuterol (VENTOLIN HFA) 108 (90 Base) MCG/ACT inhaler Inhale 2 puffs into the lungs 4 (four) times daily as needed (whezzing /  sob).      atorvastatin (LIPITOR) 20 MG tablet Take 20 mg by mouth every Monday, Wednesday, and Friday.      Berberine Chloride (BERBERINE HCI PO) Take 1 tablet by mouth 2 (two) times daily. Berberine ceylon cinnamon complex      Calcium Carb-Cholecalciferol (CALTRATE 600+D3 PO) Take 1 tablet by mouth daily.      Coenzyme Q10 (COQ10) 200 MG CAPS Take 200 mg by mouth at bedtime.       diclofenac Sodium (VOLTAREN ARTHRITIS PAIN) 1 % GEL Apply 2 g topically 4 (four) times daily  as needed (pain).      fluticasone (FLONASE) 50 MCG/ACT nasal spray Place 2 sprays into both nostrils daily as needed (allergies.).       fluticasone (FLOVENT HFA) 44 MCG/ACT inhaler Inhale 2 puffs into the lungs 2 (two) times daily as needed (shortness of breath).      glipiZIDE (GLUCOTROL) 10 MG tablet Take 10 mg by mouth every morning.      hydrochlorothiazide (MICROZIDE) 12.5 MG capsule Take 12.5 mg by mouth daily.       levothyroxine (SYNTHROID, LEVOTHROID) 75 MCG tablet Take 75 mcg by mouth daily before breakfast.       losartan (COZAAR) 25 MG tablet Take 25 mg by mouth daily.      Menthol, Topical Analgesic, (BIOFREEZE) 4 % GEL Apply 1 application topically daily as needed (pain).      metFORMIN (GLUCOPHAGE-XR) 500 MG 24 hr tablet Take 500 mg by mouth 2 (two) times daily.      Multiple Vitamin (MULTIVITAMIN WITH MINERALS) TABS tablet Take 1 tablet by mouth daily. Centrum Silver 50+      OVER THE COUNTER MEDICATION Apply 1 application topically daily as needed (pain). CBD Salve      Probiotic Product (ALIGN) 4 MG CAPS Take 4 mg by mouth daily.       RESTASIS 0.05 % ophthalmic emulsion Place 1 drop into both eyes 2 (two) times daily.      TURMERIC PO Take 1,400 mg by mouth 2 (two) times daily.      Vitamin D, Cholecalciferol, 25 MCG (1000 UT) CAPS Take 1,000 Units by mouth in the morning and at bedtime.      Allergies  Allergen Reactions   Prednisone Palpitations    Heart racing & turns red    Social History   Tobacco Use   Smoking status: Never   Smokeless tobacco: Never  Substance Use Topics   Alcohol use: No    Alcohol/week: 0.0 standard drinks of alcohol    Family History  Problem Relation Age of Onset   COPD Mother        smoked   Heart attack Father    Diabetes Father    Breast cancer Maternal Aunt      Review of Systems  Objective:  Physical Exam Vitals reviewed.  Constitutional:      Appearance: Normal appearance. She is normal weight.  HENT:     Head: Normocephalic and atraumatic.  Eyes:     Extraocular Movements: Extraocular movements intact.     Pupils: Pupils are equal, round, and reactive to light.  Cardiovascular:     Rate and Rhythm: Normal rate.     Pulses: Normal pulses.  Pulmonary:     Effort: Pulmonary effort is normal.     Breath sounds: Normal breath sounds.  Abdominal:     Palpations: Abdomen is soft.  Musculoskeletal:     Cervical back: Normal range of  motion and neck supple.     Left hip: Tenderness and bony tenderness present. Decreased range of motion. Decreased strength.  Neurological:     Mental Status: She is alert and oriented to person, place, and time.  Psychiatric:        Behavior: Behavior normal.     Vital signs in last 24 hours:    Labs:   Estimated body mass index is 27.76 kg/m as calculated from the following:   Height as of 12/09/22: 5\' 6"  (1.676 m).   Weight as of 12/09/22: 78 kg.  Imaging Review Plain radiographs demonstrate severe degenerative joint disease of the left hip(s). The bone quality appears to be good for age and reported activity level.      Assessment/Plan:  End stage arthritis, left hip(s)  The patient history, physical examination, clinical judgement of the provider and imaging studies are consistent with end stage degenerative joint disease of the left hip(s) and total hip arthroplasty is deemed medically necessary. The treatment options including medical management, injection therapy, arthroscopy and arthroplasty were discussed at length. The risks and benefits of total hip arthroplasty were presented and reviewed. The risks due to aseptic loosening, infection, stiffness, dislocation/subluxation,  thromboembolic complications and other imponderables were discussed.  The patient acknowledged the explanation, agreed to proceed with the plan and consent was signed. Patient is being admitted for inpatient treatment for surgery, pain control, PT, OT, prophylactic antibiotics, VTE prophylaxis, progressive ambulation and ADL's and discharge planning.The patient is planning to be discharged home with home health services

## 2022-12-20 ENCOUNTER — Other Ambulatory Visit: Payer: Self-pay

## 2022-12-20 ENCOUNTER — Ambulatory Visit (HOSPITAL_COMMUNITY): Payer: Medicare PPO

## 2022-12-20 ENCOUNTER — Observation Stay (HOSPITAL_COMMUNITY): Payer: Medicare PPO

## 2022-12-20 ENCOUNTER — Ambulatory Visit (HOSPITAL_BASED_OUTPATIENT_CLINIC_OR_DEPARTMENT_OTHER): Payer: Medicare PPO | Admitting: Certified Registered"

## 2022-12-20 ENCOUNTER — Encounter (HOSPITAL_COMMUNITY)
Admission: RE | Disposition: A | Discharge status: Home Health | Payer: Self-pay | Source: Home / Self Care | Attending: Orthopaedic Surgery

## 2022-12-20 ENCOUNTER — Encounter (HOSPITAL_COMMUNITY): Payer: Self-pay | Admitting: Orthopaedic Surgery

## 2022-12-20 ENCOUNTER — Observation Stay (HOSPITAL_COMMUNITY)
Admission: RE | Admit: 2022-12-20 | Discharge: 2022-12-22 | Disposition: A | Discharge status: Home Health | Payer: Medicare PPO | Attending: Orthopaedic Surgery | Admitting: Orthopaedic Surgery

## 2022-12-20 ENCOUNTER — Ambulatory Visit (HOSPITAL_COMMUNITY): Payer: Medicare PPO | Admitting: Certified Registered"

## 2022-12-20 DIAGNOSIS — I10 Essential (primary) hypertension: Secondary | ICD-10-CM | POA: Insufficient documentation

## 2022-12-20 DIAGNOSIS — J45909 Unspecified asthma, uncomplicated: Secondary | ICD-10-CM | POA: Insufficient documentation

## 2022-12-20 DIAGNOSIS — Z7984 Long term (current) use of oral hypoglycemic drugs: Secondary | ICD-10-CM | POA: Diagnosis not present

## 2022-12-20 DIAGNOSIS — E039 Hypothyroidism, unspecified: Secondary | ICD-10-CM | POA: Insufficient documentation

## 2022-12-20 DIAGNOSIS — Z96643 Presence of artificial hip joint, bilateral: Secondary | ICD-10-CM | POA: Diagnosis not present

## 2022-12-20 DIAGNOSIS — M1612 Unilateral primary osteoarthritis, left hip: Secondary | ICD-10-CM | POA: Diagnosis not present

## 2022-12-20 DIAGNOSIS — Z79899 Other long term (current) drug therapy: Secondary | ICD-10-CM | POA: Insufficient documentation

## 2022-12-20 DIAGNOSIS — Z85828 Personal history of other malignant neoplasm of skin: Secondary | ICD-10-CM | POA: Insufficient documentation

## 2022-12-20 DIAGNOSIS — Z96641 Presence of right artificial hip joint: Secondary | ICD-10-CM | POA: Diagnosis not present

## 2022-12-20 DIAGNOSIS — Z471 Aftercare following joint replacement surgery: Secondary | ICD-10-CM | POA: Diagnosis not present

## 2022-12-20 DIAGNOSIS — E119 Type 2 diabetes mellitus without complications: Secondary | ICD-10-CM | POA: Insufficient documentation

## 2022-12-20 DIAGNOSIS — Z96642 Presence of left artificial hip joint: Secondary | ICD-10-CM

## 2022-12-20 HISTORY — PX: TOTAL HIP ARTHROPLASTY: SHX124

## 2022-12-20 LAB — TYPE AND SCREEN
ABO/RH(D): O POS
Antibody Screen: NEGATIVE

## 2022-12-20 LAB — CBC WITH DIFFERENTIAL/PLATELET
Abs Immature Granulocytes: 0.02 10*3/uL (ref 0.00–0.07)
Basophils Absolute: 0 10*3/uL (ref 0.0–0.1)
Basophils Relative: 0 %
Eosinophils Absolute: 0.1 10*3/uL (ref 0.0–0.5)
Eosinophils Relative: 2 %
HCT: 38.4 % (ref 36.0–46.0)
Hemoglobin: 13.5 g/dL (ref 12.0–15.0)
Immature Granulocytes: 0 %
Lymphocytes Relative: 25 %
Lymphs Abs: 1.3 10*3/uL (ref 0.7–4.0)
MCH: 33.8 pg (ref 26.0–34.0)
MCHC: 35.2 g/dL (ref 30.0–36.0)
MCV: 96 fL (ref 80.0–100.0)
Monocytes Absolute: 0.5 10*3/uL (ref 0.1–1.0)
Monocytes Relative: 10 %
Neutro Abs: 3.1 10*3/uL (ref 1.7–7.7)
Neutrophils Relative %: 63 %
Platelets: 199 10*3/uL (ref 150–400)
RBC: 4 MIL/uL (ref 3.87–5.11)
RDW: 13.1 % (ref 11.5–15.5)
WBC: 5 10*3/uL (ref 4.0–10.5)
nRBC: 0 % (ref 0.0–0.2)

## 2022-12-20 LAB — COMPREHENSIVE METABOLIC PANEL
ALT: 40 U/L (ref 0–44)
AST: 32 U/L (ref 15–41)
Albumin: 4 g/dL (ref 3.5–5.0)
Alkaline Phosphatase: 49 U/L (ref 38–126)
Anion gap: 10 (ref 5–15)
BUN: 20 mg/dL (ref 8–23)
CO2: 24 mmol/L (ref 22–32)
Calcium: 9.4 mg/dL (ref 8.9–10.3)
Chloride: 105 mmol/L (ref 98–111)
Creatinine, Ser: 0.68 mg/dL (ref 0.44–1.00)
GFR, Estimated: >60 mL/min (ref >60)
Glucose, Bld: 144 mg/dL — ABNORMAL HIGH (ref 70–99)
Potassium: 3.6 mmol/L (ref 3.5–5.1)
Sodium: 139 mmol/L (ref 135–145)
Total Bilirubin: 0.9 mg/dL (ref 0.3–1.2)
Total Protein: 7.2 g/dL (ref 6.5–8.1)

## 2022-12-20 LAB — GLUCOSE, CAPILLARY
Glucose-Capillary: 134 mg/dL — ABNORMAL HIGH (ref 70–99)
Glucose-Capillary: 148 mg/dL — ABNORMAL HIGH (ref 70–99)

## 2022-12-20 LAB — ABO/RH: ABO/RH(D): O POS

## 2022-12-20 SURGERY — ARTHROPLASTY, HIP, TOTAL, ANTERIOR APPROACH
Anesthesia: Spinal | Site: Hip | Laterality: Left

## 2022-12-20 MED ORDER — FENTANYL CITRATE PF 50 MCG/ML IJ SOSY
PREFILLED_SYRINGE | INTRAMUSCULAR | Status: AC
  Administered 2022-12-20: 50 ug via INTRAVENOUS
  Filled 2022-12-20: qty 2

## 2022-12-20 MED ORDER — FENTANYL CITRATE (PF) 100 MCG/2ML IJ SOLN
INTRAMUSCULAR | Status: AC
  Filled 2022-12-20: qty 2

## 2022-12-20 MED ORDER — PHENYLEPHRINE HCL-NACL 20-0.9 MG/250ML-% IV SOLN
INTRAVENOUS | Status: DC | PRN
  Administered 2022-12-20: 20 ug/min via INTRAVENOUS

## 2022-12-20 MED ORDER — HYDROCODONE-ACETAMINOPHEN 7.5-325 MG PO TABS: 1.0000 | ORAL_TABLET | ORAL | Status: DC | PRN

## 2022-12-20 MED ORDER — HYDROCODONE-ACETAMINOPHEN 5-325 MG PO TABS
ORAL_TABLET | ORAL | Status: AC
  Filled 2022-12-20: qty 1

## 2022-12-20 MED ORDER — EPHEDRINE 5 MG/ML INJ
INTRAVENOUS | Status: AC
  Filled 2022-12-20: qty 5

## 2022-12-20 MED ORDER — 0.9 % SODIUM CHLORIDE (POUR BTL) OPTIME
TOPICAL | Status: DC | PRN
  Administered 2022-12-20: 500 mL

## 2022-12-20 MED ORDER — METFORMIN HCL ER 500 MG PO TB24
500.0000 mg | ORAL_TABLET | Freq: Two times a day (BID) | ORAL | Status: DC
  Administered 2022-12-21 – 2022-12-22 (×3): 500 mg via ORAL
  Filled 2022-12-20 (×3): qty 1

## 2022-12-20 MED ORDER — CHLORHEXIDINE GLUCONATE 0.12 % MT SOLN
15.0000 mL | Freq: Once | OROMUCOSAL | Status: AC
  Administered 2022-12-20: 15 mL via OROMUCOSAL

## 2022-12-20 MED ORDER — DIPHENHYDRAMINE HCL 12.5 MG/5ML PO ELIX
12.5000 mg | ORAL_SOLUTION | ORAL | Status: DC | PRN
Start: 2022-12-20 — End: 2022-12-22

## 2022-12-20 MED ORDER — MORPHINE SULFATE (PF) 2 MG/ML IV SOLN: 0.5000 mg | INTRAVENOUS | Status: DC | PRN

## 2022-12-20 MED ORDER — EPHEDRINE SULFATE-NACL 50-0.9 MG/10ML-% IV SOSY
PREFILLED_SYRINGE | INTRAVENOUS | Status: DC | PRN
  Administered 2022-12-20 (×2): 5 mg via INTRAVENOUS

## 2022-12-20 MED ORDER — HYDROMORPHONE HCL 1 MG/ML IJ SOLN
INTRAMUSCULAR | Status: AC
  Filled 2022-12-20: qty 1

## 2022-12-20 MED ORDER — MIDAZOLAM HCL 2 MG/2ML IJ SOLN
INTRAMUSCULAR | Status: AC
  Filled 2022-12-20: qty 2

## 2022-12-20 MED ORDER — LEVOTHYROXINE SODIUM 75 MCG PO TABS
75.0000 ug | ORAL_TABLET | Freq: Every day | ORAL | Status: DC
  Administered 2022-12-21 – 2022-12-22 (×2): 75 ug via ORAL
  Filled 2022-12-20 (×2): qty 1

## 2022-12-20 MED ORDER — LACTATED RINGERS IV SOLN: INTRAVENOUS | Status: DC

## 2022-12-20 MED ORDER — ATORVASTATIN CALCIUM 20 MG PO TABS
20.0000 mg | ORAL_TABLET | ORAL | Status: DC
  Administered 2022-12-20: 20 mg via ORAL
  Filled 2022-12-20: qty 1

## 2022-12-20 MED ORDER — POVIDONE-IODINE 10 % EX SWAB
2.0000 | Freq: Once | CUTANEOUS | Status: AC
  Administered 2022-12-20: 2 via TOPICAL

## 2022-12-20 MED ORDER — TRANEXAMIC ACID-NACL 1000-0.7 MG/100ML-% IV SOLN
1000.0000 mg | INTRAVENOUS | Status: AC
  Administered 2022-12-20: 1000 mg via INTRAVENOUS
  Filled 2022-12-20: qty 100

## 2022-12-20 MED ORDER — ONDANSETRON HCL 4 MG/2ML IJ SOLN
INTRAMUSCULAR | Status: AC
  Filled 2022-12-20: qty 2

## 2022-12-20 MED ORDER — PROPOFOL 1000 MG/100ML IV EMUL
INTRAVENOUS | Status: AC
  Filled 2022-12-20: qty 100

## 2022-12-20 MED ORDER — ASPIRIN 81 MG PO CHEW
81.0000 mg | CHEWABLE_TABLET | Freq: Two times a day (BID) | ORAL | Status: DC
  Administered 2022-12-20 – 2022-12-22 (×4): 81 mg via ORAL
  Filled 2022-12-20 (×4): qty 1

## 2022-12-20 MED ORDER — DEXAMETHASONE SODIUM PHOSPHATE 10 MG/ML IJ SOLN
INTRAMUSCULAR | Status: AC
  Filled 2022-12-20: qty 1

## 2022-12-20 MED ORDER — ALUM & MAG HYDROXIDE-SIMETH 200-200-20 MG/5ML PO SUSP: 30.0000 mL | ORAL | Status: DC | PRN

## 2022-12-20 MED ORDER — FENTANYL CITRATE (PF) 100 MCG/2ML IJ SOLN
INTRAMUSCULAR | Status: DC | PRN
  Administered 2022-12-20: 50 ug via INTRAVENOUS

## 2022-12-20 MED ORDER — DEXAMETHASONE SODIUM PHOSPHATE 10 MG/ML IJ SOLN
INTRAMUSCULAR | Status: DC | PRN
  Administered 2022-12-20: 4 mg via INTRAVENOUS

## 2022-12-20 MED ORDER — FENTANYL CITRATE PF 50 MCG/ML IJ SOSY
25.0000 ug | PREFILLED_SYRINGE | INTRAMUSCULAR | Status: DC | PRN
  Administered 2022-12-20 (×2): 50 ug via INTRAVENOUS

## 2022-12-20 MED ORDER — METOCLOPRAMIDE HCL 5 MG PO TABS: 5.0000 mg | ORAL_TABLET | Freq: Three times a day (TID) | ORAL | Status: DC | PRN

## 2022-12-20 MED ORDER — HYDROCHLOROTHIAZIDE 12.5 MG PO TABS
12.5000 mg | ORAL_TABLET | Freq: Every day | ORAL | Status: DC
  Administered 2022-12-20 – 2022-12-22 (×3): 12.5 mg via ORAL
  Filled 2022-12-20 (×3): qty 1

## 2022-12-20 MED ORDER — ONDANSETRON HCL 4 MG PO TABS: 4.0000 mg | ORAL_TABLET | Freq: Four times a day (QID) | ORAL | Status: DC | PRN

## 2022-12-20 MED ORDER — PANTOPRAZOLE SODIUM 40 MG PO TBEC
40.0000 mg | DELAYED_RELEASE_TABLET | Freq: Every day | ORAL | Status: DC
Start: 2022-12-20 — End: 2022-12-22
  Administered 2022-12-20 – 2022-12-22 (×3): 40 mg via ORAL
  Filled 2022-12-20 (×3): qty 1

## 2022-12-20 MED ORDER — CEFAZOLIN SODIUM-DEXTROSE 2-4 GM/100ML-% IV SOLN
2.0000 g | INTRAVENOUS | Status: AC
Start: 2022-12-20 — End: 2022-12-20
  Administered 2022-12-20: 2 g via INTRAVENOUS
  Filled 2022-12-20: qty 100

## 2022-12-20 MED ORDER — METOCLOPRAMIDE HCL 5 MG/ML IJ SOLN: 5.0000 mg | Freq: Three times a day (TID) | INTRAMUSCULAR | Status: DC | PRN

## 2022-12-20 MED ORDER — LIDOCAINE 2% (20 MG/ML) 5 ML SYRINGE
INTRAMUSCULAR | Status: DC | PRN
  Administered 2022-12-20: 40 mg via INTRAVENOUS

## 2022-12-20 MED ORDER — GLIPIZIDE 10 MG PO TABS
10.0000 mg | ORAL_TABLET | Freq: Every day | ORAL | Status: DC
  Administered 2022-12-21 – 2022-12-22 (×2): 10 mg via ORAL
  Filled 2022-12-20 (×2): qty 1

## 2022-12-20 MED ORDER — METHOCARBAMOL 1000 MG/10ML IJ SOLN: 500.0000 mg | Freq: Four times a day (QID) | INTRAMUSCULAR | Status: DC | PRN

## 2022-12-20 MED ORDER — CYCLOSPORINE 0.05 % OP EMUL
1.0000 [drp] | Freq: Two times a day (BID) | OPHTHALMIC | Status: DC
  Administered 2022-12-20 – 2022-12-22 (×4): 1 [drp] via OPHTHALMIC
  Filled 2022-12-20 (×4): qty 30

## 2022-12-20 MED ORDER — MENTHOL 3 MG MT LOZG: 1.0000 | LOZENGE | OROMUCOSAL | Status: DC | PRN

## 2022-12-20 MED ORDER — SODIUM CHLORIDE 0.9 % IV SOLN
INTRAVENOUS | Status: DC
Start: 2022-12-20 — End: 2022-12-21

## 2022-12-20 MED ORDER — TRAMADOL HCL 50 MG PO TABS
50.0000 mg | ORAL_TABLET | Freq: Four times a day (QID) | ORAL | Status: DC
  Administered 2022-12-20 – 2022-12-22 (×7): 50 mg via ORAL
  Filled 2022-12-20 (×7): qty 1

## 2022-12-20 MED ORDER — ONDANSETRON HCL 4 MG/2ML IJ SOLN: 4.0000 mg | Freq: Four times a day (QID) | INTRAMUSCULAR | Status: DC | PRN

## 2022-12-20 MED ORDER — PROPOFOL 10 MG/ML IV BOLUS
INTRAVENOUS | Status: AC
  Filled 2022-12-20: qty 40

## 2022-12-20 MED ORDER — LOSARTAN POTASSIUM 25 MG PO TABS
25.0000 mg | ORAL_TABLET | Freq: Every day | ORAL | Status: DC
  Administered 2022-12-20 – 2022-12-22 (×3): 25 mg via ORAL
  Filled 2022-12-20 (×3): qty 1

## 2022-12-20 MED ORDER — ACETAMINOPHEN 325 MG PO TABS: 325.0000 mg | ORAL_TABLET | Freq: Four times a day (QID) | ORAL | Status: DC | PRN

## 2022-12-20 MED ORDER — PROPOFOL 10 MG/ML IV BOLUS
INTRAVENOUS | Status: DC | PRN
  Administered 2022-12-20: 10 mg via INTRAVENOUS
  Administered 2022-12-20: 20 mg via INTRAVENOUS
  Administered 2022-12-20: 10 mg via INTRAVENOUS

## 2022-12-20 MED ORDER — ACETAMINOPHEN 500 MG PO TABS
1000.0000 mg | ORAL_TABLET | Freq: Once | ORAL | Status: AC
  Administered 2022-12-20: 1000 mg via ORAL
  Filled 2022-12-20: qty 2

## 2022-12-20 MED ORDER — PHENOL 1.4 % MT LIQD: 1.0000 | OROMUCOSAL | Status: DC | PRN

## 2022-12-20 MED ORDER — ORAL CARE MOUTH RINSE
15.0000 mL | Freq: Once | OROMUCOSAL | Status: AC
Start: 2022-12-20 — End: 2022-12-20

## 2022-12-20 MED ORDER — LIDOCAINE HCL (PF) 2 % IJ SOLN
INTRAMUSCULAR | Status: AC
  Filled 2022-12-20: qty 5

## 2022-12-20 MED ORDER — SODIUM CHLORIDE 0.9 % IR SOLN
Status: DC | PRN
  Administered 2022-12-20: 1000 mL

## 2022-12-20 MED ORDER — BUPIVACAINE IN DEXTROSE 0.75-8.25 % IT SOLN
INTRATHECAL | Status: DC | PRN
  Administered 2022-12-20: 1.6 mL via INTRATHECAL

## 2022-12-20 MED ORDER — HYDROMORPHONE HCL 1 MG/ML IJ SOLN
0.2500 mg | INTRAMUSCULAR | Status: DC | PRN
  Administered 2022-12-20 (×2): 0.5 mg via INTRAVENOUS

## 2022-12-20 MED ORDER — METHOCARBAMOL 500 MG PO TABS
500.0000 mg | ORAL_TABLET | Freq: Four times a day (QID) | ORAL | Status: DC | PRN
  Administered 2022-12-21 (×2): 500 mg via ORAL
  Filled 2022-12-20 (×2): qty 1

## 2022-12-20 MED ORDER — HYDROCODONE-ACETAMINOPHEN 5-325 MG PO TABS
ORAL_TABLET | ORAL | Status: AC
  Administered 2022-12-20: 1 via ORAL
  Filled 2022-12-20: qty 1

## 2022-12-20 MED ORDER — PROPOFOL 500 MG/50ML IV EMUL
INTRAVENOUS | Status: DC | PRN
  Administered 2022-12-20: 60 ug/kg/min via INTRAVENOUS

## 2022-12-20 MED ORDER — ALBUTEROL SULFATE (2.5 MG/3ML) 0.083% IN NEBU: 3.0000 mL | INHALATION_SOLUTION | Freq: Four times a day (QID) | RESPIRATORY_TRACT | Status: DC | PRN

## 2022-12-20 MED ORDER — CEFAZOLIN SODIUM-DEXTROSE 1-4 GM/50ML-% IV SOLN
1.0000 g | Freq: Four times a day (QID) | INTRAVENOUS | Status: AC
  Administered 2022-12-20 (×2): 1 g via INTRAVENOUS
  Filled 2022-12-20 (×2): qty 50

## 2022-12-20 MED ORDER — FENTANYL CITRATE PF 50 MCG/ML IJ SOSY
PREFILLED_SYRINGE | INTRAMUSCULAR | Status: AC
  Filled 2022-12-20: qty 1

## 2022-12-20 MED ORDER — ONDANSETRON HCL 4 MG/2ML IJ SOLN: 4.0000 mg | Freq: Once | INTRAMUSCULAR | Status: DC | PRN

## 2022-12-20 MED ORDER — ONDANSETRON HCL 4 MG/2ML IJ SOLN
INTRAMUSCULAR | Status: DC | PRN
  Administered 2022-12-20: 4 mg via INTRAVENOUS

## 2022-12-20 MED ORDER — HYDROCODONE-ACETAMINOPHEN 5-325 MG PO TABS
1.0000 | ORAL_TABLET | ORAL | Status: DC | PRN
  Administered 2022-12-20: 2 via ORAL
  Administered 2022-12-20: 1 via ORAL
  Administered 2022-12-21 (×2): 2 via ORAL
  Filled 2022-12-20 (×4): qty 2

## 2022-12-20 MED ORDER — DOCUSATE SODIUM 100 MG PO CAPS
100.0000 mg | ORAL_CAPSULE | Freq: Two times a day (BID) | ORAL | Status: DC
  Administered 2022-12-20 – 2022-12-22 (×4): 100 mg via ORAL
  Filled 2022-12-20 (×4): qty 1

## 2022-12-20 SURGICAL SUPPLY — 41 items
APL SKNCLS STERI-STRIP NONHPOA (GAUZE/BANDAGES/DRESSINGS)
BAG COUNTER SPONGE SURGICOUNT (BAG) ×1 IMPLANT
BAG SPEC THK2 15X12 ZIP CLS (MISCELLANEOUS)
BAG SPNG CNTER NS LX DISP (BAG) ×1
BAG ZIPLOCK 12X15 (MISCELLANEOUS) IMPLANT
BENZOIN TINCTURE PRP APPL 2/3 (GAUZE/BANDAGES/DRESSINGS) IMPLANT
BLADE SAW SGTL 18X1.27X75 (BLADE) ×1 IMPLANT
COVER PERINEAL POST (MISCELLANEOUS) ×1 IMPLANT
COVER SURGICAL LIGHT HANDLE (MISCELLANEOUS) ×1 IMPLANT
CUP SECTOR GRIPTON 50MM (Cup) IMPLANT
DRAPE FOOT SWITCH (DRAPES) ×1 IMPLANT
DRAPE STERI IOBAN 125X83 (DRAPES) ×1 IMPLANT
DRAPE U-SHAPE 47X51 STRL (DRAPES) ×2 IMPLANT
DRSG AQUACEL AG ADV 3.5X10 (GAUZE/BANDAGES/DRESSINGS) ×1 IMPLANT
DURAPREP 26ML APPLICATOR (WOUND CARE) ×1 IMPLANT
ELECT REM PT RETURN 15FT ADLT (MISCELLANEOUS) ×1 IMPLANT
GAUZE XEROFORM 1X8 LF (GAUZE/BANDAGES/DRESSINGS) IMPLANT
GLOVE BIO SURGEON STRL SZ7.5 (GLOVE) ×1 IMPLANT
GLOVE BIOGEL PI IND STRL 8 (GLOVE) ×2 IMPLANT
GLOVE ECLIPSE 8.0 STRL XLNG CF (GLOVE) ×1 IMPLANT
GOWN STRL REUS W/ TWL XL LVL3 (GOWN DISPOSABLE) ×2 IMPLANT
GOWN STRL REUS W/TWL XL LVL3 (GOWN DISPOSABLE) ×2
HANDPIECE INTERPULSE COAX TIP (DISPOSABLE) ×1
HEAD FEM STD 32X+5 STRL (Hips) IMPLANT
HOLDER FOLEY CATH W/STRAP (MISCELLANEOUS) ×1 IMPLANT
KIT TURNOVER KIT A (KITS) IMPLANT
LINER ACETABULAR 32X50 (Liner) IMPLANT
PACK ANTERIOR HIP CUSTOM (KITS) ×1 IMPLANT
SET HNDPC FAN SPRY TIP SCT (DISPOSABLE) ×1 IMPLANT
STAPLER SKIN PROX WIDE 3.9 (STAPLE) IMPLANT
STEM CORAIL KA09 (Stem) IMPLANT
STRIP CLOSURE SKIN 1/2X4 (GAUZE/BANDAGES/DRESSINGS) IMPLANT
SUT ETHIBOND NAB CT1 #1 30IN (SUTURE) ×1 IMPLANT
SUT ETHILON 2 0 PS N (SUTURE) IMPLANT
SUT MNCRL AB 4-0 PS2 18 (SUTURE) IMPLANT
SUT VIC AB 0 CT1 36 (SUTURE) ×1 IMPLANT
SUT VIC AB 1 CT1 36 (SUTURE) ×1 IMPLANT
SUT VIC AB 2-0 CT1 27 (SUTURE) ×2
SUT VIC AB 2-0 CT1 TAPERPNT 27 (SUTURE) ×2 IMPLANT
TRAY FOLEY MTR SLVR 16FR STAT (SET/KITS/TRAYS/PACK) IMPLANT
YANKAUER SUCT BULB TIP NO VENT (SUCTIONS) ×1 IMPLANT

## 2022-12-20 NOTE — Interval H&P Note (Signed)
History and Physical Interval Note: The patient understands that she is here today for a left hip replacement to treat her severe left hip arthritis.  Having had this before on her right side she is fully aware of the risk and benefits of the surgery.  There has been no acute or interval change in her medical status.  Informed consent has been obtained and the left operative hip has been marked.  12/20/2022 7:50 AM  Whitney Gill  has presented today for surgery, with the diagnosis of left hip osteoarthritis.  The various methods of treatment have been discussed with the patient and family. After consideration of risks, benefits and other options for treatment, the patient has consented to  Procedure(s): LEFT TOTAL HIP ARTHROPLASTY ANTERIOR APPROACH (Left) as a surgical intervention.  The patient's history has been reviewed, patient examined, no change in status, stable for surgery.  I have reviewed the patient's chart and labs.  Questions were answered to the patient's satisfaction.     Kathryne Hitch

## 2022-12-20 NOTE — Plan of Care (Signed)
  Problem: Education: Goal: Knowledge of General Education information will improve Description: Including pain rating scale, medication(s)/side effects and non-pharmacologic comfort measures Outcome: Progressing   Problem: Clinical Measurements: Goal: Respiratory complications will improve Outcome: Progressing   Problem: Nutrition: Goal: Adequate nutrition will be maintained Outcome: Progressing   Problem: Elimination: Goal: Will not experience complications related to bowel motility Outcome: Progressing   Problem: Safety: Goal: Ability to remain free from injury will improve Outcome: Progressing

## 2022-12-20 NOTE — Progress Notes (Signed)
Report called to 3W RN.  

## 2022-12-20 NOTE — Plan of Care (Signed)
  Problem: Education: Goal: Knowledge of General Education information will improve Description: Including pain rating scale, medication(s)/side effects and non-pharmacologic comfort measures Outcome: Progressing   Problem: Clinical Measurements: Goal: Respiratory complications will improve Outcome: Progressing   Problem: Nutrition: Goal: Adequate nutrition will be maintained Outcome: Progressing   Problem: Elimination: Goal: Will not experience complications related to bowel motility Outcome: Progressing   Problem: Safety: Goal: Ability to remain free from injury will improve Outcome: Progressing   Problem: Education: Goal: Knowledge of the prescribed therapeutic regimen will improve Outcome: Progressing   Problem: Activity: Goal: Ability to avoid complications of mobility impairment will improve Outcome: Progressing   Problem: Pain Management: Goal: Pain level will decrease with appropriate interventions Outcome: Progressing

## 2022-12-20 NOTE — Op Note (Signed)
Operative Note  Date of operation: 12/20/2022 Preoperative diagnosis: Left hip primary osteoarthritis Postoperative diagnosis: Same  Procedure: Left direct anterior total hip arthroplasty  Implants: Implant Name Type Inv. Item Serial No. Manufacturer Lot No. LRB No. Used Action  CUP SECTOR GRIPTON - [id number] Cup CUP SECTOR GRIPTON  [employer]PU[employer] [id number] Left 1 Implanted  LINER ACETABULAR 32X50 - [id number] Liner LINER ACETABULAR 32X50  DEPUY ORTHOPA[employer] [id number] Left 1 Implanted  STEM CORAIL KA09 - [id number] Stem STEM CORAIL KA09  DEPUY ORTH[employer] [id number] Left 1 Implanted  HEAD FEM STD 32X+5 STRL - [id number] Hips HEAD FEM STD 32X+5 STRL  DEPUY ORTH[employer] [id number] Left 1 Implanted   Surgeon: Vanita Panda. Magnus Ivan, MD Assistant: Rexene Edison, PA-C  Anesthesia: Spinal EBL: 100 cc Antibiotics: IV Ancef Complications: None  Indications: Whitney Gill is a 77 year old female well-known to me.  We actually replaced her right hip successfully about 2 years ago.  He has done very well with that hip replacement.  She has developed significant and severe arthritis involving her left hip.  At this point it is detrimentally affecting her mobility, her quality of life and actives daily living and she wished to proceed with a hip replaced on that left side.  I agree with this as well based on her clinical exam findings and x-ray findings combined with failure conservative treatment.  Having had this before she is fully aware of the risk of acute blood loss anemia, nerve vessel injury, fracture, infection, DVT, implant failure, dislocation, leg length differences and wound healing issues.  She understands our goals are hopefully decrease pain, improve mobility and improve quality of life.  Procedure description: After informed consent was obtained and the appropriate left hip was marked, the patient was brought to the operating room and set up on the stretcher where spinal anesthesia was  obtained.  She was laid in spine position on the stretcher and a Foley catheter is placed.  Traction boots were placed on both her feet and she was next placed supine on the Hana table with a perineal post in place in both legs and inline skeletal traction devices but no traction applied.  Her left operative hip and pelvis were assessed radiographically.  The left hip was prepped and draped with DuraPrep and sterile drapes.  A timeout was called and she was identified as the correct patient and the correct left hip.  An incision was then made just inferior and posterior to the ASIS and carried slightly obliquely down the leg.  Dissection was carried down to the tensor fascia lata muscle and the tensor fascia was then divided longitudinally to proceed with a direct anterior approach to the hip.  Circumflex vessels were identified and cauterized.  The hip capsule was identified and opened up in L-type format finding a moderate joint effusion.  Cobra retractors were placed around the medial and lateral femoral neck and a femoral neck cut was made with an oscillating saw just proximal to the lesser trochanter.  This cut was completed with an osteotome.  A corkscrew guide was placed in the femoral head and the femoral head was removed in's entirety and there was a wide area devoid of cartilage.  A bent Hohmann was then placed over the medial acetabular rim and remnants of the acetabular labrum and other debris removed.  Reaming was then initiated from a size 43 reamer and stepwise increments going up to a size 49 reamer with all reamers placed under direct visualization and elastomer placed under  direct fluoroscopy in order to obtain the depth and reaming, the inclination and the anteversion.  The real DePuy sector GRIPTION acetabular component size 50 was then placed without difficulty again under direct visualization and fluoroscopy.  A 32+0 liner was placed within that size 50 acetabular component.  Attention was then  turned to the femur.  With the left leg externally rotated to 120 degrees, extended and adducted, a Mueller retractor was placed medially by the calcar and a Hohmann retractor behind the greater trochanter.  The lateral joint capsule was released and a box cutting osteotome was used into the femoral canal.  Broaching was then initiated using the Corail broaching system from a size 8 going just to a size 9 and this correlated with her other side as well.  We then trialed a standard offset femoral neck with a 32+1 trial hip ball.  The left leg was brought over and up and with traction and internal rotation reduced in the pelvis.  We are pleased with leg length, offset, range of motion and stability assessed radiographically and clinically.  The hip was then dislocated and the trial components were removed.  We placed the real femoral component size 9 with standard offset and the real 32+1 metal hip ball.  Again this was reduced in the acetabulum and we are pleased overall.  The soft tissue was then irrigated normal saline solution.  The joint capsule was closed with interrupted #1 Ethibond suture.  #1 Vicryl was used to close the tensor fascia.  0 Vicryl was used to close deep tissue and 2-0 Vicryl used to close subcutaneous tissue.  The skin was closed with staples.  An Aquacel dressing was applied.  The patient was taken off of the Hana table and taken to the recovery room in stable condition.  Rexene Edison, PA-C did assist during the entire case and beginning to end and his assistance was crucial and medically necessary for soft tissue management and retraction, helping guide implant placement and a layered closure of the wound.

## 2022-12-20 NOTE — Transfer of Care (Signed)
Immediate Anesthesia Transfer of Care Note  Patient: Whitney Gill  Procedure(s) Performed: LEFT TOTAL HIP ARTHROPLASTY ANTERIOR APPROACH (Left: Hip)  Patient Location: PACU  Anesthesia Type:Spinal  Level of Consciousness: awake, drowsy, and patient cooperative  Airway & Oxygen Therapy: Patient Spontanous Breathing and Patient connected to face mask oxygen  Post-op Assessment: Report given to RN and Post -op Vital signs reviewed and stable  Post vital signs: Reviewed and stable  Last Vitals:  Vitals Value Taken Time  BP 112/61 12/20/22 1045  Temp    Pulse 79 12/20/22 1045  Resp 21 12/20/22 1045  SpO2 100 % 12/20/22 1045  Vitals shown include unfiled device data.  Last Pain:  Vitals:   12/20/22 0812  TempSrc:   PainSc: 0-No pain         Complications: No notable events documented.

## 2022-12-20 NOTE — Progress Notes (Signed)
PT Cancellation Note  Patient Details Name: KENTAVIA HOGENSON MRN: [id number] DOB: [birth year]   Cancelled Treatment:     PT eval attempted but deferred at request of pt 2* elevated pain level.  Pt agreeable to PT in am.   Osie Merkin 12/20/2022, 5:26 PM

## 2022-12-20 NOTE — Anesthesia Procedure Notes (Signed)
Procedure Name: MAC Date/Time: 12/20/2022 9:06 AM  Performed by: Sindy Guadeloupe, CRNAPre-anesthesia Checklist: Patient identified, Emergency Drugs available, Suction available, Patient being monitored and Timeout performed Oxygen Delivery Method: Simple face mask Placement Confirmation: positive ETCO2

## 2022-12-20 NOTE — Anesthesia Preprocedure Evaluation (Addendum)
Anesthesia Evaluation  Patient identified by MRN, date of birth, ID band Patient awake    Reviewed: Allergy & Precautions, NPO status , Patient's Chart, lab work & pertinent test results  Airway Mallampati: II  TM Distance: >3 FB Neck ROM: Full    Dental  (+) Teeth Intact, Dental Advisory Given, Chipped,    Pulmonary asthma    Pulmonary exam normal breath sounds clear to auscultation       Cardiovascular hypertension, Pt. on medications Normal cardiovascular exam Rhythm:Regular Rate:Normal     Neuro/Psych  PSYCHIATRIC DISORDERS Anxiety     negative neurological ROS     GI/Hepatic Neg liver ROS,GERD  ,,  Endo/Other  diabetes, Type 2, Oral Hypoglycemic AgentsHypothyroidism    Renal/GU negative Renal ROS     Musculoskeletal  (+) Arthritis ,  left hip osteoarthritis   Abdominal   Peds  Hematology negative hematology ROS (+) Plt 199k   Anesthesia Other Findings Day of surgery medications reviewed with the patient.  Reproductive/Obstetrics                             Anesthesia Physical Anesthesia Plan  ASA: 3  Anesthesia Plan: Spinal   Post-op Pain Management: Tylenol PO (pre-op)*   Induction: Intravenous  PONV Risk Score and Plan: 2 and TIVA, Dexamethasone and Ondansetron  Airway Management Planned: Natural Airway and Simple Face Mask  Additional Equipment:   Intra-op Plan:   Post-operative Plan:   Informed Consent: I have reviewed the patients History and Physical, chart, labs and discussed the procedure including the risks, benefits and alternatives for the proposed anesthesia with the patient or authorized representative who has indicated his/her understanding and acceptance.     Dental advisory given  Plan Discussed with: CRNA, Anesthesiologist and Surgeon  Anesthesia Plan Comments: (Discussed risks and benefits of and differences between spinal and general. Discussed  risks of spinal including headache, backache, failure, bleeding, infection, and nerve damage. Patient consents to spinal. Questions answered. Coagulation studies and platelet count acceptable.)       Anesthesia Quick Evaluation

## 2022-12-20 NOTE — Anesthesia Postprocedure Evaluation (Signed)
Anesthesia Post Note  Patient: Whitney Gill  Procedure(s) Performed: LEFT TOTAL HIP ARTHROPLASTY ANTERIOR APPROACH (Left: Hip)     Patient location during evaluation: PACU Anesthesia Type: Spinal Level of consciousness: awake, awake and alert and oriented Pain management: pain level controlled Vital Signs Assessment: post-procedure vital signs reviewed and stable Respiratory status: spontaneous breathing, nonlabored ventilation and respiratory function stable Cardiovascular status: blood pressure returned to baseline and stable Postop Assessment: no headache, no backache, spinal receding and no apparent nausea or vomiting Anesthetic complications: no   No notable events documented.  Last Vitals:    Last Pain:                 Collene Schlichter

## 2022-12-20 NOTE — Anesthesia Procedure Notes (Signed)
Spinal  Patient location during procedure: OR Start time: 12/20/2022 9:06 AM End time: 12/20/2022 9:09 AM Reason for block: surgical anesthesia Staffing Performed: anesthesiologist  Anesthesiologist: Collene Schlichter, MD Performed by: Collene Schlichter, MD Authorized by: Collene Schlichter, MD   Preanesthetic Checklist Completed: patient identified, IV checked, risks and benefits discussed, surgical consent, monitors and equipment checked, pre-op evaluation and timeout performed Spinal Block Patient position: sitting Prep: DuraPrep and site prepped and draped Patient monitoring: continuous pulse ox and blood pressure Approach: midline Location: L3-4 Injection technique: single-shot Needle Needle type: Pencan  Needle gauge: 24 G Assessment Events: CSF return Additional Notes Functioning IV was confirmed and monitors were applied. Sterile prep and drape, including hand hygiene, mask and sterile gloves were used. The patient was positioned and the spine was prepped. The skin was anesthetized with lidocaine.  Free flow of clear CSF was obtained prior to injecting local anesthetic into the CSF.  The spinal needle aspirated freely following injection.  The needle was carefully withdrawn.  The patient tolerated the procedure well. Consent was obtained prior to procedure with all questions answered and concerns addressed. Risks including but not limited to bleeding, infection, nerve damage, paralysis, failed block, inadequate analgesia, allergic reaction, high spinal, itching and headache were discussed and the patient wished to proceed.   Arrie Aran, MD

## 2022-12-21 DIAGNOSIS — Z85828 Personal history of other malignant neoplasm of skin: Secondary | ICD-10-CM | POA: Diagnosis not present

## 2022-12-21 DIAGNOSIS — M1612 Unilateral primary osteoarthritis, left hip: Secondary | ICD-10-CM | POA: Diagnosis not present

## 2022-12-21 DIAGNOSIS — J45909 Unspecified asthma, uncomplicated: Secondary | ICD-10-CM | POA: Diagnosis not present

## 2022-12-21 DIAGNOSIS — Z79899 Other long term (current) drug therapy: Secondary | ICD-10-CM | POA: Diagnosis not present

## 2022-12-21 DIAGNOSIS — E119 Type 2 diabetes mellitus without complications: Secondary | ICD-10-CM | POA: Diagnosis not present

## 2022-12-21 DIAGNOSIS — E039 Hypothyroidism, unspecified: Secondary | ICD-10-CM | POA: Diagnosis not present

## 2022-12-21 DIAGNOSIS — I10 Essential (primary) hypertension: Secondary | ICD-10-CM | POA: Diagnosis not present

## 2022-12-21 DIAGNOSIS — Z96641 Presence of right artificial hip joint: Secondary | ICD-10-CM | POA: Diagnosis not present

## 2022-12-21 DIAGNOSIS — Z7984 Long term (current) use of oral hypoglycemic drugs: Secondary | ICD-10-CM | POA: Diagnosis not present

## 2022-12-21 LAB — CBC
HCT: 34.2 % — ABNORMAL LOW (ref 36.0–46.0)
Hemoglobin: 11.5 g/dL — ABNORMAL LOW (ref 12.0–15.0)
MCH: 33.6 pg (ref 26.0–34.0)
MCHC: 33.6 g/dL (ref 30.0–36.0)
MCV: 100 fL (ref 80.0–100.0)
Platelets: 158 10*3/uL (ref 150–400)
RBC: 3.42 MIL/uL — ABNORMAL LOW (ref 3.87–5.11)
RDW: 13.2 % (ref 11.5–15.5)
WBC: 8.2 10*3/uL (ref 4.0–10.5)
nRBC: 0 % (ref 0.0–0.2)

## 2022-12-21 LAB — BASIC METABOLIC PANEL
Anion gap: 8 (ref 5–15)
BUN: 15 mg/dL (ref 8–23)
CO2: 26 mmol/L (ref 22–32)
Calcium: 8.8 mg/dL — ABNORMAL LOW (ref 8.9–10.3)
Chloride: 104 mmol/L (ref 98–111)
Creatinine, Ser: 0.55 mg/dL (ref 0.44–1.00)
GFR, Estimated: >60 mL/min (ref >60)
Glucose, Bld: 166 mg/dL — ABNORMAL HIGH (ref 70–99)
Potassium: 4.2 mmol/L (ref 3.5–5.1)
Sodium: 138 mmol/L (ref 135–145)

## 2022-12-21 MED ORDER — METHOCARBAMOL 500 MG PO TABS: 500.0000 mg | ORAL_TABLET | Freq: Four times a day (QID) | ORAL | 0 refills | Status: DC | PRN

## 2022-12-21 MED ORDER — HYDROCODONE-ACETAMINOPHEN 5-325 MG PO TABS: 1.0000 | ORAL_TABLET | Freq: Four times a day (QID) | ORAL | 0 refills | Status: DC | PRN

## 2022-12-21 MED ORDER — ASPIRIN 81 MG PO CHEW: 81.0000 mg | CHEWABLE_TABLET | Freq: Two times a day (BID) | ORAL | 0 refills | Status: AC

## 2022-12-21 NOTE — Plan of Care (Signed)
  Problem: Education: Goal: Knowledge of General Education information will improve Description: Including pain rating scale, medication(s)/side effects and non-pharmacologic comfort measures Outcome: Adequate for Discharge   Problem: Health Behavior/Discharge Planning: Goal: Ability to manage health-related needs will improve Outcome: Adequate for Discharge   Problem: Clinical Measurements: Goal: Ability to maintain clinical measurements within normal limits will improve Outcome: Progressing Goal: Will remain free from infection Outcome: Progressing Goal: Diagnostic test results will improve Outcome: Progressing Goal: Respiratory complications will improve Outcome: Adequate for Discharge Goal: Cardiovascular complication will be avoided Outcome: Progressing   Problem: Activity: Goal: Risk for activity intolerance will decrease Outcome: Completed/Met   Problem: Nutrition: Goal: Adequate nutrition will be maintained Outcome: Completed/Met   Problem: Coping: Goal: Level of anxiety will decrease Outcome: Progressing   Problem: Elimination: Goal: Will not experience complications related to bowel motility Outcome: Progressing Goal: Will not experience complications related to urinary retention Outcome: Completed/Met   Problem: Pain Management: Goal: General experience of comfort will improve Outcome: Adequate for Discharge   Problem: Safety: Goal: Ability to remain free from injury will improve Outcome: Adequate for Discharge   Problem: Education: Goal: Knowledge of the prescribed therapeutic regimen will improve Outcome: Adequate for Discharge Goal: Understanding of discharge needs will improve Outcome: Progressing Goal: Individualized Educational Video(s) Outcome: Completed/Met   Problem: Activity: Goal: Ability to avoid complications of mobility impairment will improve Outcome: Adequate for Discharge   Problem: Clinical Measurements: Goal: Postoperative  complications will be avoided or minimized Outcome: Progressing   Problem: Pain Management: Goal: Pain level will decrease with appropriate interventions Outcome: Adequate for Discharge

## 2022-12-21 NOTE — Care Management Obs Status (Signed)
MEDICARE OBSERVATION STATUS NOTIFICATION   Patient Details  Name: Whitney Gill MRN: [id number] Date of Birth: 11/22/1945   Medicare Observation Status Notification Given:  Yes    Halford Chessman 12/21/2022, 5:23 PM

## 2022-12-21 NOTE — Discharge Instructions (Signed)

## 2022-12-21 NOTE — Plan of Care (Signed)

## 2022-12-21 NOTE — TOC Transition Note (Signed)
Transition of Care [facility]) - CM/SW Discharge Note   Patient Details  Name: Whitney Gill MRN: [id number] Date of Birth: Jan 24, 1946  Transition of Care [facility]) CM/SW Contact:  Darleene Cleaver, LCSW Phone Number: 12/21/2022, 5:35 PM   Clinical Narrative:     Patient's home health has been prearranged with [facility].  CSW confirmed with patent's husband that she is set up with [facility] PT.  CSW also confirmed with patient's husband that she has a walker and does not need any other DME.  OBS letter has also been provided.  TOC signing off, please reconsult if any other TOC needs arise.   Final next level of care: Home w Home Health Services Barriers to Discharge: Barriers Resolved   Patient Goals and CMS Choice CMS Medicare.gov Compare Post Acute Care list provided to:: Patient Represenative (must comment) (Patient's husband) Choice offered to / list presented to : Spouse  Discharge Placement                         Discharge Plan and Services Additional resources added to the After Visit Summary for                            [facility] Arranged: PT HH Agency: [facility]        Social Determinants of Health (SDOH) Interventions SDOH Screenings   Food Insecurity: No Food Insecurity (12/20/2022)  Housing: Low Risk  (12/20/2022)  Transportation Needs: No Transportation Needs (12/20/2022)  Utilities: Not At Risk (12/20/2022)  Tobacco Use: Low Risk  (12/20/2022)     Readmission Risk Interventions     No data to display

## 2022-12-21 NOTE — Progress Notes (Signed)
Physical Therapy Treatment Patient Details Name: Whitney Gill MRN: [id number] DOB: [birth year] Today's Date: 12/21/2022   History of Present Illness 77 yo female s/p L THA, AA on 12/21/22. PMH: R THA 2022, HTN, DM, OA, hypothyroidism    PT Comments  Pt is progressing steadily with PT, very motivated; incr gait distance/activity tol this pm an decr assist needing with STS transfers. Pt will likely be ready for d/c tomorrow     If plan is discharge home, recommend the following: Assistance with cooking/housework;A little help with walking and/or transfers;A little help with bathing/dressing/bathroom;Help with stairs or ramp for entrance;Assist for transportation   Can travel by private vehicle        Equipment Recommendations  None recommended by PT    Recommendations for Other Services       Precautions / Restrictions Precautions Precautions: Fall Restrictions Weight Bearing Restrictions: No LLE Weight Bearing: Weight bearing as tolerated Other Position/Activity Restrictions: WBAT     Mobility  Bed Mobility Overal bed mobility: Needs Assistance Bed Mobility: Supine to Sit, Sit to Supine     Supine to sit: Min assist Sit to supine: Min assist   General bed mobility comments: assist with LLE    Transfers Overall transfer level: Needs assistance Equipment used: Rolling walker (2 wheels) Transfers: Sit to/from Stand Sit to Stand: Contact guard assist, Min assist           General transfer comment: CGA to min assist to rise and transition to RW; cues for hand placement    Ambulation/Gait Ambulation/Gait assistance: Contact guard assist Gait Distance (Feet): 140 Feet Assistive device: Rolling walker (2 wheels) Gait Pattern/deviations: Step-through pattern, Step-to pattern       General Gait Details: cues for sequence, incr wt RLE as tol,  and RW position; 2 standing rests, beginning step through pattern   Stairs             Wheelchair Mobility      Tilt Bed    Modified Rankin (Stroke Patients Only)       Balance Overall balance assessment: Mild deficits observed, not formally tested                                          Cognition Arousal: Alert Behavior During Therapy: WFL for tasks assessed/performed Overall Cognitive Status: Within Functional Limits for tasks assessed                                          Exercises Total Joint Exercises Ankle Circles/Pumps: AROM, Both, 10 reps Quad Sets: AROM, 5 reps, Both Heel Slides: AAROM, Left, 10 reps    General Comments        Pertinent Vitals/Pain Pain Assessment Pain Assessment: 0-10 Pain Score: 4  Pain Location: L hip Pain Descriptors / Indicators: Grimacing, Sore, Burning Pain Intervention(s): Limited activity within patient's tolerance, Monitored during session, Premedicated before session    Home Living Family/patient expects to be discharged to:: Private residence Living Arrangements: Spouse/significant other Available Help at Discharge: Family Type of Home: House Home Access: Stairs to enter Entrance Stairs-Rails: None Entrance Stairs-Number of Steps: 2 small deep steps   Home Layout: One level Home Equipment: Agricultural consultant (2 wheels);BSC/3in1;Cane - single point;Transport chair      Prior Function  PT Goals (current goals can now be found in the care plan section) Acute Rehab PT Goals Patient Stated Goal: home tomorrow PT Goal Formulation: With patient Time For Goal Achievement: 12/27/22 Potential to Achieve Goals: Good Progress towards PT goals: Progressing toward goals    Frequency    7X/week      PT Plan      Co-evaluation              AM-PAC PT "6 Clicks" Mobility   Outcome Measure  Help needed turning from your back to your side while in a flat bed without using bedrails?: A Little Help needed moving from lying on your back to sitting on the side of a flat bed without  using bedrails?: A Little Help needed moving to and from a bed to a chair (including a wheelchair)?: A Little Help needed standing up from a chair using your arms (e.g., wheelchair or bedside chair)?: A Little Help needed to walk in hospital room?: A Little Help needed climbing 3-5 steps with a railing? : A Little 6 Click Score: 18    End of Session Equipment Utilized During Treatment: Gait belt Activity Tolerance: Patient tolerated treatment well Patient left: with call bell/phone within reach;in bed;with bed alarm set   PT Visit Diagnosis: Other abnormalities of gait and mobility (R26.89);Difficulty in walking, not elsewhere classified (R26.2)     Time: 1450-1516 PT Time Calculation (min) (ACUTE ONLY): 26 min  Charges:    $Gait Training: 23-37 mins PT General Charges $$ ACUTE PT VISIT: 1 Visit                     Reyhan Moronta, PT  Acute Rehab Dept ([facility]) [phone]  12/21/2022    [facility] 12/21/2022, 3:21 PM

## 2022-12-21 NOTE — Progress Notes (Signed)
Subjective: 1 Day Post-Op Procedure(s) (LRB): LEFT TOTAL HIP ARTHROPLASTY ANTERIOR APPROACH (Left) Patient reports pain as moderate.    Objective: Vital signs in last 24 hours: Temp:  [97 F (36.1 C)-98.3 F (36.8 C)] 98.3 F (36.8 C) (10/26 1013) Pulse Rate:  [55-81] 74 (10/26 1013) Resp:  [8-19] 16 (10/26 1013) BP: (105-136)/(50-81) 112/50 (10/26 1013) SpO2:  [85 %-99 %] 97 % (10/26 1013)  Intake/Output from previous day: 10/25 0701 - 10/26 0700 In: 1544.1 [P.O.:480; I.V.:814.1; IV Piggyback:250] Out: 2400 [Urine:2300; Blood:100] Intake/Output this shift: No intake/output data recorded.  Recent Labs    12/20/22 0820 12/21/22 0412  HGB 13.5 11.5*   Recent Labs    12/20/22 0820 12/21/22 0412  WBC 5.0 8.2  RBC 4.00 3.42*  HCT 38.4 34.2*  PLT 199 158   Recent Labs    12/20/22 0820 12/21/22 0412  NA 139 138  K 3.6 4.2  CL 105 104  CO2 24 26  BUN 20 15  CREATININE 0.68 0.55  GLUCOSE 144* 166*  CALCIUM 9.4 8.8*   No results for input(s): "LABPT", "INR" in the last 72 hours.  Sensation intact distally Intact pulses distally Dorsiflexion/Plantar flexion intact Incision: dressing C/D/I   Assessment/Plan: 1 Day Post-Op Procedure(s) (LRB): LEFT TOTAL HIP ARTHROPLASTY ANTERIOR APPROACH (Left) Up with therapy Plan for discharge tomorrow Discharge home with home health      Kathryne Hitch 12/21/2022, 11:29 AM

## 2022-12-21 NOTE — Evaluation (Signed)
Physical Therapy Evaluation Patient Details Name: Whitney Gill MRN: [id number] DOB: 03-17-1945 Today's Date: 12/21/2022  History of Present Illness  77 yo female s/p L THA, AA on 12/21/22. PMH: R THA 2022, HTN, DM, OA, hypothyroidism  Clinical Impression  Pt is s/p THA resulting in the deficits listed below (see PT Problem List).  Pt amb ~ 12' with RW  and CGA; anticipate steady progress in acute setting  Pt will benefit from acute skilled PT to increase their independence and safety with mobility to facilitate discharge.          If plan is discharge home, recommend the following: Assistance with cooking/housework;A little help with walking and/or transfers;A little help with bathing/dressing/bathroom;Help with stairs or ramp for entrance;Assist for transportation   Can travel by private vehicle        Equipment Recommendations None recommended by PT  Recommendations for Other Services       Functional Status Assessment Patient has had a recent decline in their functional status and demonstrates the ability to make significant improvements in function in a reasonable and predictable amount of time.     Precautions / Restrictions Precautions Precautions: Fall Restrictions Weight Bearing Restrictions: No Other Position/Activity Restrictions: WBAT      Mobility  Bed Mobility               General bed mobility comments: in recliner and returned to same    Transfers Overall transfer level: Needs assistance Equipment used: Rolling walker (2 wheels) Transfers: Sit to/from Stand Sit to Stand: Min assist           General transfer comment: assist to rise and transition to RW    Ambulation/Gait Ambulation/Gait assistance: Contact guard assist Gait Distance (Feet): 60 Feet Assistive device: Rolling walker (2 wheels) Gait Pattern/deviations: Step-to pattern       General Gait Details: cues for sequence and RW position  Stairs             Wheelchair Mobility     Tilt Bed    Modified Rankin (Stroke Patients Only)       Balance Overall balance assessment: Mild deficits observed, not formally tested                                           Pertinent Vitals/Pain Pain Assessment Pain Assessment: 0-10 Pain Score: 3  Pain Location: L hip Pain Descriptors / Indicators: Grimacing, Sore, Burning Pain Intervention(s): Limited activity within patient's tolerance, Monitored during session, Premedicated before session, Repositioned    Home Living Family/patient expects to be discharged to:: Private residence Living Arrangements: Spouse/significant other Available Help at Discharge: Family Type of Home: House Home Access: Stairs to enter Entrance Stairs-Rails: None Entrance Stairs-Number of Steps: 2 small deep steps   Home Layout: One level Home Equipment: Agricultural consultant (2 wheels);BSC/3in1;Cane - single point;Transport chair      Prior Function Prior Level of Function : Independent/Modified Independent                     Extremity/Trunk Assessment   Upper Extremity Assessment Upper Extremity Assessment: Overall WFL for tasks assessed    Lower Extremity Assessment Lower Extremity Assessment: LLE deficits/detail LLE Deficits / Details: ankle WFL, knee and hip grossly 3+/5 limited by post op pain/weakness       Communication   Communication Communication: No apparent difficulties  Cognition Arousal: Alert Behavior During Therapy: WFL for tasks assessed/performed Overall Cognitive Status: Within Functional Limits for tasks assessed                                          General Comments      Exercises Total Joint Exercises Ankle Circles/Pumps: AROM, Both, 5 reps Quad Sets: Both, AROM, 10 reps   Assessment/Plan    PT Assessment Patient needs continued PT services  PT Problem List Decreased strength;Decreased activity tolerance;Decreased  balance;Decreased mobility;Pain       PT Treatment Interventions DME instruction;Gait training;Stair training;Functional mobility training;Therapeutic activities;Therapeutic exercise;Patient/family education    PT Goals (Current goals can be found in the Care Plan section)  Acute Rehab PT Goals Patient Stated Goal: home tomorrow PT Goal Formulation: With patient Time For Goal Achievement: 12/27/22 Potential to Achieve Goals: Good    Frequency 7X/week     Co-evaluation               AM-PAC PT "6 Clicks" Mobility  Outcome Measure Help needed turning from your back to your side while in a flat bed without using bedrails?: A Little Help needed moving from lying on your back to sitting on the side of a flat bed without using bedrails?: A Little Help needed moving to and from a bed to a chair (including a wheelchair)?: A Little Help needed standing up from a chair using your arms (e.g., wheelchair or bedside chair)?: A Little Help needed to walk in hospital room?: A Little Help needed climbing 3-5 steps with a railing? : A Little 6 Click Score: 18    End of Session Equipment Utilized During Treatment: Gait belt Activity Tolerance: Patient tolerated treatment well Patient left: in chair;with call bell/phone within reach;Other (comment);with family/visitor present (alarm not set on PT arrival with pt in chair per nursing staff)   PT Visit Diagnosis: Other abnormalities of gait and mobility (R26.89);Difficulty in walking, not elsewhere classified (R26.2)    Time: 0865-7846 PT Time Calculation (min) (ACUTE ONLY): 20 min   Charges:   PT Evaluation $PT Eval Low Complexity: 1 Low   PT General Charges $$ ACUTE PT VISIT: 1 Visit         Ange Puskas, PT  Acute Rehab Dept [facility]) [phone]  12/21/2022   [facility] 12/21/2022, 11:37 AM

## 2022-12-22 ENCOUNTER — Other Ambulatory Visit: Payer: Self-pay | Admitting: Physician Assistant

## 2022-12-22 DIAGNOSIS — M1612 Unilateral primary osteoarthritis, left hip: Secondary | ICD-10-CM | POA: Diagnosis not present

## 2022-12-22 NOTE — Progress Notes (Signed)
Subjective: 2 Days Post-Op Procedure(s) (LRB): LEFT TOTAL HIP ARTHROPLASTY ANTERIOR APPROACH (Left) Patient reports pain as mild.    Objective: Vital signs in last 24 hours: Temp:  [98 F (36.7 C)-99.8 F (37.7 C)] 99.8 F (37.7 C) (10/27 0629) Pulse Rate:  [78-95] 92 (10/27 0629) Resp:  [16-20] 20 (10/27 0629) BP: (109-116)/(45-52) 116/49 (10/27 0629) SpO2:  [95 %-98 %] 95 % (10/27 0629)  Intake/Output from previous day: 10/26 0701 - 10/27 0700 In: 762.9 [P.O.:600; I.V.:162.9] Out: 1125 [Urine:1125] Intake/Output this shift: No intake/output data recorded.  Recent Labs    12/20/22 0820 12/21/22 0412  HGB 13.5 11.5*   Recent Labs    12/20/22 0820 12/21/22 0412  WBC 5.0 8.2  RBC 4.00 3.42*  HCT 38.4 34.2*  PLT 199 158   Recent Labs    12/20/22 0820 12/21/22 0412  NA 139 138  K 3.6 4.2  CL 105 104  CO2 24 26  BUN 20 15  CREATININE 0.68 0.55  GLUCOSE 144* 166*  CALCIUM 9.4 8.8*   No results for input(s): "LABPT", "INR" in the last 72 hours.  Neurologically intact Neurovascular intact Sensation intact distally Intact pulses distally Dorsiflexion/Plantar flexion intact Incision: dressing C/D/I No cellulitis present Compartment soft   Assessment/Plan: 2 Days Post-Op Procedure(s) (LRB): LEFT TOTAL HIP ARTHROPLASTY ANTERIOR APPROACH (Left) Advance diet Up with therapy D/C IV fluids Discharge home with home health once cleared by PT (likely after first session today) WBAT LLE ABLA- mild and stable      Cristie Hem 12/22/2022, 10:54 AM

## 2022-12-22 NOTE — Progress Notes (Signed)
Physical Therapy Treatment Patient Details Name: Whitney Gill MRN: [id number] DOB: Dec 26, 1945 Today's Date: 12/22/2022   History of Present Illness 77 yo female s/p L THA, AA on 12/21/22. PMH: R THA 2022, HTN, DM, OA, hypothyroidism    PT Comments  Pt continues to progress, meeting  goals. Reviewed THA HEP and limiting # of exercises until HHPT begins as pt is having more soreness today (pt reports she amb 2 more times with nursing staff yesterday and this may be contributing to elevated pain). Pt educated on refraining from incr activity too quickly and verbalizes understanding. Reviewed/educated on car transfers/safety. Pt husband present for session, very supportive. Pt is ready for d/c home with family assist as needed from PT standpoint.    If plan is discharge home, recommend the following: Assistance with cooking/housework;A little help with walking and/or transfers;A little help with bathing/dressing/bathroom;Help with stairs or ramp for entrance;Assist for transportation   Can travel by private vehicle        Equipment Recommendations  None recommended by PT    Recommendations for Other Services       Precautions / Restrictions Precautions Precautions: Fall Restrictions Weight Bearing Restrictions: No LLE Weight Bearing: Weight bearing as tolerated     Mobility  Bed Mobility               General bed mobility comments: in recliner on arrival and returned to same    Transfers Overall transfer level: Needs assistance Equipment used: Rolling walker (2 wheels) Transfers: Sit to/from Stand Sit to Stand: Contact guard assist, Supervision           General transfer comment: close SUpervision to CGA  to rise and transition to RW; cues for hand placement and LLE position    Ambulation/Gait Ambulation/Gait assistance: Contact guard assist, Supervision Gait Distance (Feet): 80 Feet (12' more) Assistive device: Rolling walker (2 wheels) Gait  Pattern/deviations: Decreased weight shift to left       General Gait Details: cues for sequence, posture,  and RW position; pt reporting more soreness, still able to advance LLE of her own volition   Stairs Stairs:  (pt husband states he "built a ramp" by [employer] meaning he placed plywood over the 2 steps so pt may enter home via w/c)           Wheelchair Mobility     Tilt Bed    Modified Rankin (Stroke Patients Only)       Balance Overall balance assessment: Mild deficits observed, not formally tested                                          Cognition Arousal: Alert Behavior During Therapy: WFL for tasks assessed/performed Overall Cognitive Status: Within Functional Limits for tasks assessed                                 General Comments: assisted pt with donning depends and educated pt/husband on technique.        Exercises      General Comments        Pertinent Vitals/Pain Pain Assessment Pain Assessment: 0-10 Pain Score: 4  Pain Location: L hip Pain Descriptors / Indicators: Grimacing, Sore, Burning Pain Intervention(s): Limited activity within patient's tolerance, Monitored during session, Premedicated before session    Home Living  Prior Function            PT Goals (current goals can now be found in the care plan section) Acute Rehab PT Goals Patient Stated Goal: home tomorrow PT Goal Formulation: With patient Time For Goal Achievement: 12/27/22 Potential to Achieve Goals: Good Progress towards PT goals: Progressing toward goals    Frequency    7X/week      PT Plan      Co-evaluation              AM-PAC PT "6 Clicks" Mobility   Outcome Measure  Help needed turning from your back to your side while in a flat bed without using bedrails?: A Little Help needed moving from lying on your back to sitting on the side of a flat bed without using bedrails?: A  Little Help needed moving to and from a bed to a chair (including a wheelchair)?: A Little Help needed standing up from a chair using your arms (e.g., wheelchair or bedside chair)?: A Little Help needed to walk in hospital room?: A Little Help needed climbing 3-5 steps with a railing? : A Little 6 Click Score: 18    End of Session Equipment Utilized During Treatment: Gait belt Activity Tolerance: Patient tolerated treatment well Patient left: with call bell/phone within reach;in chair;with family/visitor present   PT Visit Diagnosis: Other abnormalities of gait and mobility (R26.89);Difficulty in walking, not elsewhere classified (R26.2)     Time: 9629-5284 PT Time Calculation (min) (ACUTE ONLY): 39 min  Charges:    $Gait Training: 23-37 mins $Therapeutic Activity: 8-22 mins PT General Charges $$ ACUTE PT VISIT: 1 Visit                     Luxe Cuadros, PT  Acute Rehab Dept [facility]) [phone]  12/22/2022    [facility] 12/22/2022, 11:57 AM

## 2022-12-22 NOTE — Progress Notes (Signed)
Reviewed d/c instructions with pt. All questions answered. Pt taken to front entrance via wheelchair to meet ride.

## 2022-12-22 NOTE — Discharge Summary (Signed)
Patient ID: Whitney Gill MRN: [id number] DOB/AGE: 10/25/1945 78 y.o.  Admit date: 12/20/2022 Discharge date: 12/22/2022  Admission Diagnoses:  Principal Problem:   Unilateral primary osteoarthritis, left hip Active Problems:   Status post total replacement of left hip   Discharge Diagnoses:  Same  Past Medical History:  Diagnosis Date   Anxiety    Arthritis    hip, knee   Asthma    Cancer (HCC)    skin cancer on top of head   Diabetes mellitus without complication (HCC)    Hypertension    Hypothyroidism    Pneumonia     Surgeries: Procedure(s): LEFT TOTAL HIP ARTHROPLASTY ANTERIOR APPROACH on 12/20/2022   Consultants:   Discharged Condition: Improved  Hospital Course: Whitney Gill is an 77 y.o. female who was admitted 12/20/2022 for operative treatment ofUnilateral primary osteoarthritis, left hip. Patient has severe unremitting pain that affects sleep, daily activities, and work/hobbies. After pre-op clearance the patient was taken to the operating room on 12/20/2022 and underwent  Procedure(s): LEFT TOTAL HIP ARTHROPLASTY ANTERIOR APPROACH.    Patient was given perioperative antibiotics:  Anti-infectives (From admission, onward)    Start     Dose/Rate Route Frequency Ordered Stop   12/20/22 1600  ceFAZolin (ANCEF) IVPB 1 g/50 mL premix        1 g 100 mL/hr over 30 Minutes Intravenous Every 6 hours 12/20/22 1409 12/21/22 1404   12/20/22 0800  ceFAZolin (ANCEF) IVPB 2g/100 mL premix        2 g 200 mL/hr over 30 Minutes Intravenous On call to O.R. 12/20/22 0746 12/20/22 0915        Patient was given sequential compression devices, early ambulation, and chemoprophylaxis to prevent DVT.  Patient benefited maximally from hospital stay and there were no complications.    Recent vital signs: Patient Vitals for the past 24 hrs:  BP Temp Temp src Pulse Resp SpO2  12/22/22 0629 (!) 116/49 99.8 F (37.7 C) Oral 92 20 95 %  12/21/22 2322 (!) 112/45 99.5 F  (37.5 C) Oral 95 16 96 %  12/21/22 1436 (!) 109/52 98 F (36.7 C) -- 78 18 98 %     Recent laboratory studies:  Recent Labs    12/20/22 0820 12/21/22 0412  WBC 5.0 8.2  HGB 13.5 11.5*  HCT 38.4 34.2*  PLT 199 158  NA 139 138  K 3.6 4.2  CL 105 104  CO2 24 26  BUN 20 15  CREATININE 0.68 0.55  GLUCOSE 144* 166*  CALCIUM 9.4 8.8*     Discharge Medications:   Allergies as of 12/22/2022       Reactions   Prednisone Palpitations   Heart racing & turns red        Medication List     TAKE these medications    acetaminophen 650 MG CR tablet Commonly known as: TYLENOL Take 650-1,300 mg by mouth every 8 (eight) hours as needed for pain.   albuterol 108 (90 Base) MCG/ACT inhaler Commonly known as: VENTOLIN HFA Inhale 2 puffs into the lungs 4 (four) times daily as needed (whezzing /  sob).   Align 4 MG Caps Take 4 mg by mouth daily.   aspirin 81 MG chewable tablet Chew 1 tablet (81 mg total) by mouth 2 (two) times daily.   atorvastatin 20 MG tablet Commonly known as: LIPITOR Take 20 mg by mouth every Monday, Wednesday, and Friday.   BERBERINE HCI PO Take 1 tablet by mouth 2 (  two) times daily. Berberine ceylon cinnamon complex   Biofreeze 4 % Gel Generic drug: Menthol (Topical Analgesic) Apply 1 application topically daily as needed (pain).   CALTRATE 600+D3 PO Take 1 tablet by mouth daily.   CoQ10 200 MG Caps Take 200 mg by mouth at bedtime.   fluticasone 44 MCG/ACT inhaler Commonly known as: FLOVENT HFA Inhale 2 puffs into the lungs 2 (two) times daily as needed (shortness of breath).   fluticasone 50 MCG/ACT nasal spray Commonly known as: FLONASE Place 2 sprays into both nostrils daily as needed (allergies.).   glipiZIDE 10 MG tablet Commonly known as: GLUCOTROL Take 10 mg by mouth every morning.   hydrochlorothiazide 12.5 MG capsule Commonly known as: MICROZIDE Take 12.5 mg by mouth daily.   HYDROcodone-acetaminophen 5-325 MG  tablet Commonly known as: NORCO/VICODIN Take 1-2 tablets by mouth every 6 (six) hours as needed for moderate pain (pain score 4-6) (pain score 4-6).   levothyroxine 75 MCG tablet Commonly known as: SYNTHROID Take 75 mcg by mouth daily before breakfast.   losartan 25 MG tablet Commonly known as: COZAAR Take 25 mg by mouth daily.   metFORMIN 500 MG 24 hr tablet Commonly known as: GLUCOPHAGE-XR Take 500 mg by mouth 2 (two) times daily.   methocarbamol 500 MG tablet Commonly known as: ROBAXIN Take 1 tablet (500 mg total) by mouth every 6 (six) hours as needed for muscle spasms.   multivitamin with minerals Tabs tablet Take 1 tablet by mouth daily. Centrum Silver 50+   OVER THE COUNTER MEDICATION Apply 1 application topically daily as needed (pain). CBD Salve   Restasis 0.05 % ophthalmic emulsion Generic drug: cycloSPORINE Place 1 drop into both eyes 2 (two) times daily.   TURMERIC PO Take 1,400 mg by mouth 2 (two) times daily.   Vitamin D (Cholecalciferol) 25 MCG (1000 UT) Caps Take 1,000 Units by mouth in the morning and at bedtime.   Voltaren Arthritis Pain 1 % Gel Generic drug: diclofenac Sodium Apply 2 g topically 4 (four) times daily as needed (pain).               Durable Medical Equipment  (From admission, onward)           Start     Ordered   12/20/22 1545  DME 3 n 1  Once        12/20/22 1544   12/20/22 1545  DME Walker rolling  Once       Question Answer Comment  Walker: With 5 Inch Wheels   Patient needs a walker to treat with the following condition Status post total replacement of left hip      12/20/22 1544            Diagnostic Studies: DG HIP UNILAT WITH PELVIS 1V LEFT  Result Date: 12/20/2022 CLINICAL DATA:  Status post total left hip arthroplasty. Intraoperative fluoroscopy. EXAM: DG HIP (WITH OR WITHOUT PELVIS) 1V*L* COMPARISON:  Pelvis and left hip radiographs 09/04/2022 FINDINGS: Images were performed intraoperatively without  the presence of a radiologist. New total left hip arthroplasty. Partial visualization of prior total right hip arthroplasty. No hardware complication is seen. Total fluoroscopy images: 3 Total fluoroscopy time: 16 seconds Total dose: Radiation Exposure Index (as provided by the fluoroscopic device): 2.4 mGy air Kerma Please see intraoperative findings for further detail. IMPRESSION: Intraoperative fluoroscopy for total left hip arthroplasty. Electronically Signed   By: Neita Garnet M.D.   On: 12/20/2022 13:52   DG Pelvis Portable  Result Date: 12/20/2022 CLINICAL DATA:  Status post total left hip arthroplasty. EXAM: PORTABLE PELVIS 1-2 VIEWS COMPARISON:  AP pelvis 03/03/2020, pelvis and left hip radiographs 09/04/2022 FINDINGS: Interval total left hip arthroplasty. Redemonstration of total right hip arthroplasty. No perihardware lucency is seen to indicate hardware failure or loosening. Expected postoperative changes of lateral left hip subcutaneous air and lateral left hip surgical skin staples. Mild-to-moderate left-greater-than-right inferior sacroiliac subchondral sclerosis. Mild superior pubic symphysis joint space narrowing. No acute fracture or dislocation. IMPRESSION: Interval total left hip arthroplasty without evidence of hardware failure. Electronically Signed   By: Neita Garnet M.D.   On: 12/20/2022 13:51   DG C-Arm 1-60 Min-No Report  Result Date: 12/20/2022 Fluoroscopy was utilized by the requesting physician.  No radiographic interpretation.    Disposition: Discharge disposition: 01-Home or Self Care          Follow-up Information     Kathryne Hitch, MD Follow up in 2 week(s).   Specialty: Orthopedic Surgery Contact information: [address] [place] [address] [phone]         [facility] Follow up.   Why: Iantha Fallen has been prearranged for your home health physical therapy. Contact information: [address]  [place] [address] [phone]                  Signed: Cristie Hem 12/22/2022, 10:55 AM

## 2022-12-23 ENCOUNTER — Encounter (HOSPITAL_COMMUNITY): Payer: Self-pay | Admitting: Orthopaedic Surgery

## 2022-12-24 DIAGNOSIS — Z471 Aftercare following joint replacement surgery: Secondary | ICD-10-CM | POA: Diagnosis not present

## 2022-12-24 DIAGNOSIS — J45909 Unspecified asthma, uncomplicated: Secondary | ICD-10-CM | POA: Diagnosis not present

## 2022-12-24 DIAGNOSIS — K219 Gastro-esophageal reflux disease without esophagitis: Secondary | ICD-10-CM | POA: Diagnosis not present

## 2022-12-24 DIAGNOSIS — E039 Hypothyroidism, unspecified: Secondary | ICD-10-CM | POA: Diagnosis not present

## 2022-12-24 DIAGNOSIS — I10 Essential (primary) hypertension: Secondary | ICD-10-CM | POA: Diagnosis not present

## 2022-12-24 DIAGNOSIS — Z7984 Long term (current) use of oral hypoglycemic drugs: Secondary | ICD-10-CM | POA: Diagnosis not present

## 2022-12-24 DIAGNOSIS — E119 Type 2 diabetes mellitus without complications: Secondary | ICD-10-CM | POA: Diagnosis not present

## 2022-12-24 DIAGNOSIS — Z96642 Presence of left artificial hip joint: Secondary | ICD-10-CM | POA: Diagnosis not present

## 2022-12-24 DIAGNOSIS — F419 Anxiety disorder, unspecified: Secondary | ICD-10-CM | POA: Diagnosis not present

## 2022-12-26 DIAGNOSIS — I10 Essential (primary) hypertension: Secondary | ICD-10-CM | POA: Diagnosis not present

## 2022-12-26 DIAGNOSIS — F419 Anxiety disorder, unspecified: Secondary | ICD-10-CM | POA: Diagnosis not present

## 2022-12-26 DIAGNOSIS — K219 Gastro-esophageal reflux disease without esophagitis: Secondary | ICD-10-CM | POA: Diagnosis not present

## 2022-12-26 DIAGNOSIS — E119 Type 2 diabetes mellitus without complications: Secondary | ICD-10-CM | POA: Diagnosis not present

## 2022-12-26 DIAGNOSIS — Z7984 Long term (current) use of oral hypoglycemic drugs: Secondary | ICD-10-CM | POA: Diagnosis not present

## 2022-12-26 DIAGNOSIS — J45909 Unspecified asthma, uncomplicated: Secondary | ICD-10-CM | POA: Diagnosis not present

## 2022-12-26 DIAGNOSIS — Z471 Aftercare following joint replacement surgery: Secondary | ICD-10-CM | POA: Diagnosis not present

## 2022-12-26 DIAGNOSIS — Z96642 Presence of left artificial hip joint: Secondary | ICD-10-CM | POA: Diagnosis not present

## 2022-12-26 DIAGNOSIS — E039 Hypothyroidism, unspecified: Secondary | ICD-10-CM | POA: Diagnosis not present

## 2022-12-30 DIAGNOSIS — J45909 Unspecified asthma, uncomplicated: Secondary | ICD-10-CM | POA: Diagnosis not present

## 2022-12-30 DIAGNOSIS — E119 Type 2 diabetes mellitus without complications: Secondary | ICD-10-CM | POA: Diagnosis not present

## 2022-12-30 DIAGNOSIS — Z7984 Long term (current) use of oral hypoglycemic drugs: Secondary | ICD-10-CM | POA: Diagnosis not present

## 2022-12-30 DIAGNOSIS — F419 Anxiety disorder, unspecified: Secondary | ICD-10-CM | POA: Diagnosis not present

## 2022-12-30 DIAGNOSIS — Z471 Aftercare following joint replacement surgery: Secondary | ICD-10-CM | POA: Diagnosis not present

## 2022-12-30 DIAGNOSIS — K219 Gastro-esophageal reflux disease without esophagitis: Secondary | ICD-10-CM | POA: Diagnosis not present

## 2022-12-30 DIAGNOSIS — I10 Essential (primary) hypertension: Secondary | ICD-10-CM | POA: Diagnosis not present

## 2022-12-30 DIAGNOSIS — E039 Hypothyroidism, unspecified: Secondary | ICD-10-CM | POA: Diagnosis not present

## 2022-12-30 DIAGNOSIS — Z96642 Presence of left artificial hip joint: Secondary | ICD-10-CM | POA: Diagnosis not present

## 2022-12-31 ENCOUNTER — Other Ambulatory Visit: Payer: Self-pay | Admitting: Orthopaedic Surgery

## 2023-01-01 DIAGNOSIS — Z7984 Long term (current) use of oral hypoglycemic drugs: Secondary | ICD-10-CM | POA: Diagnosis not present

## 2023-01-01 DIAGNOSIS — Z471 Aftercare following joint replacement surgery: Secondary | ICD-10-CM | POA: Diagnosis not present

## 2023-01-01 DIAGNOSIS — E039 Hypothyroidism, unspecified: Secondary | ICD-10-CM | POA: Diagnosis not present

## 2023-01-01 DIAGNOSIS — K219 Gastro-esophageal reflux disease without esophagitis: Secondary | ICD-10-CM | POA: Diagnosis not present

## 2023-01-01 DIAGNOSIS — I10 Essential (primary) hypertension: Secondary | ICD-10-CM | POA: Diagnosis not present

## 2023-01-01 DIAGNOSIS — Z96642 Presence of left artificial hip joint: Secondary | ICD-10-CM | POA: Diagnosis not present

## 2023-01-01 DIAGNOSIS — J45909 Unspecified asthma, uncomplicated: Secondary | ICD-10-CM | POA: Diagnosis not present

## 2023-01-01 DIAGNOSIS — F419 Anxiety disorder, unspecified: Secondary | ICD-10-CM | POA: Diagnosis not present

## 2023-01-01 DIAGNOSIS — E119 Type 2 diabetes mellitus without complications: Secondary | ICD-10-CM | POA: Diagnosis not present

## 2023-01-02 ENCOUNTER — Encounter: Payer: Self-pay | Admitting: Orthopaedic Surgery

## 2023-01-02 ENCOUNTER — Ambulatory Visit: Payer: Medicare PPO | Admitting: Orthopaedic Surgery

## 2023-01-02 DIAGNOSIS — Z96642 Presence of left artificial hip joint: Secondary | ICD-10-CM

## 2023-01-02 NOTE — Progress Notes (Signed)
The patient is a 77 year old female who is here for her first postoperative visit status post a left total hip arthroplasty 2 weeks ago.  We have replaced her right hip in the past.  She has been working with her balance and coordination mobility with home health therapy.  She has another week session with them next week.  If she is not getting around well after then she knows to contact us and we can set her up for outpatient physical therapy in [place].  Her left hip incision looks good.  The staples were removed and Steri-Strips applied.  Her calf is soft.  She has been compliant with a baby aspirin twice daily which she can now stop.  She also knows that she can drive from my standpoint when she is comfortable.  She also notes that if she needs refill of the medication is not to hesitate to give [place] a call.  We will see her back in a month to see how she is doing overall but no x-rays are needed.  All questions and concerns were addressed and answered.

## 2023-01-08 DIAGNOSIS — Z471 Aftercare following joint replacement surgery: Secondary | ICD-10-CM | POA: Diagnosis not present

## 2023-01-08 DIAGNOSIS — E039 Hypothyroidism, unspecified: Secondary | ICD-10-CM | POA: Diagnosis not present

## 2023-01-08 DIAGNOSIS — K219 Gastro-esophageal reflux disease without esophagitis: Secondary | ICD-10-CM | POA: Diagnosis not present

## 2023-01-08 DIAGNOSIS — I10 Essential (primary) hypertension: Secondary | ICD-10-CM | POA: Diagnosis not present

## 2023-01-08 DIAGNOSIS — E119 Type 2 diabetes mellitus without complications: Secondary | ICD-10-CM | POA: Diagnosis not present

## 2023-01-08 DIAGNOSIS — F419 Anxiety disorder, unspecified: Secondary | ICD-10-CM | POA: Diagnosis not present

## 2023-01-08 DIAGNOSIS — J45909 Unspecified asthma, uncomplicated: Secondary | ICD-10-CM | POA: Diagnosis not present

## 2023-01-08 DIAGNOSIS — Z96642 Presence of left artificial hip joint: Secondary | ICD-10-CM | POA: Diagnosis not present

## 2023-01-08 DIAGNOSIS — Z7984 Long term (current) use of oral hypoglycemic drugs: Secondary | ICD-10-CM | POA: Diagnosis not present

## 2023-01-14 ENCOUNTER — Telehealth: Payer: Self-pay | Admitting: *Deleted

## 2023-01-14 ENCOUNTER — Other Ambulatory Visit: Payer: Self-pay | Admitting: Orthopaedic Surgery

## 2023-01-14 DIAGNOSIS — Z471 Aftercare following joint replacement surgery: Secondary | ICD-10-CM | POA: Diagnosis not present

## 2023-01-14 DIAGNOSIS — E119 Type 2 diabetes mellitus without complications: Secondary | ICD-10-CM | POA: Diagnosis not present

## 2023-01-14 DIAGNOSIS — I10 Essential (primary) hypertension: Secondary | ICD-10-CM | POA: Diagnosis not present

## 2023-01-14 DIAGNOSIS — E039 Hypothyroidism, unspecified: Secondary | ICD-10-CM | POA: Diagnosis not present

## 2023-01-14 DIAGNOSIS — Z7984 Long term (current) use of oral hypoglycemic drugs: Secondary | ICD-10-CM | POA: Diagnosis not present

## 2023-01-14 DIAGNOSIS — K219 Gastro-esophageal reflux disease without esophagitis: Secondary | ICD-10-CM | POA: Diagnosis not present

## 2023-01-14 DIAGNOSIS — F419 Anxiety disorder, unspecified: Secondary | ICD-10-CM | POA: Diagnosis not present

## 2023-01-14 DIAGNOSIS — J45909 Unspecified asthma, uncomplicated: Secondary | ICD-10-CM | POA: Diagnosis not present

## 2023-01-14 DIAGNOSIS — Z96642 Presence of left artificial hip joint: Secondary | ICD-10-CM | POA: Diagnosis not present

## 2023-01-14 MED ORDER — HYDROCODONE-ACETAMINOPHEN 5-325 MG PO TABS: 1.0000 | ORAL_TABLET | Freq: Four times a day (QID) | ORAL | 0 refills | Status: AC | PRN

## 2023-01-14 MED ORDER — METHOCARBAMOL 500 MG PO TABS: 500.0000 mg | ORAL_TABLET | Freq: Four times a day (QID) | ORAL | 0 refills | Status: AC | PRN

## 2023-01-14 NOTE — Telephone Encounter (Signed)
Patient called and requested a refill of pain medication and muscle relaxer be sent to [facility] if possible. Thank you.

## 2023-01-24 DIAGNOSIS — I10 Essential (primary) hypertension: Secondary | ICD-10-CM | POA: Diagnosis not present

## 2023-01-24 DIAGNOSIS — E1140 Type 2 diabetes mellitus with diabetic neuropathy, unspecified: Secondary | ICD-10-CM | POA: Diagnosis not present

## 2023-01-30 ENCOUNTER — Encounter: Payer: Self-pay | Admitting: Orthopaedic Surgery

## 2023-01-30 ENCOUNTER — Ambulatory Visit: Payer: Medicare PPO | Admitting: Orthopaedic Surgery

## 2023-01-30 DIAGNOSIS — Z96642 Presence of left artificial hip joint: Secondary | ICD-10-CM

## 2023-01-30 NOTE — Progress Notes (Signed)
The patient comes in today at 6 weeks status post a left total hip replacement.  She is having some soreness and still some pain.  She is 77 years old she is ambulate with a cane.  She has been taking only occasional hydrocodone.  She cannot sleep well at night but that is a chronic issue but she really cannot get a comfortable she states.  Overall though she is injury doing well.  There are some stiffness with her hip on the left side with internal and external rotation but overall it looks like things are improving slowly.  I told her I am fine with her slowly increasing her activities as comfort allows and I can call in any type of medications that she may need if she runs low.  From my standpoint I will need to see her back for 3 months unless she is having issues.  Will have a standing AP pelvis and lateral of her left hip at that visit.

## 2023-02-04 DIAGNOSIS — L84 Corns and callosities: Secondary | ICD-10-CM | POA: Diagnosis not present

## 2023-02-04 DIAGNOSIS — B351 Tinea unguium: Secondary | ICD-10-CM | POA: Diagnosis not present

## 2023-02-04 DIAGNOSIS — M79676 Pain in unspecified toe(s): Secondary | ICD-10-CM | POA: Diagnosis not present

## 2023-02-04 DIAGNOSIS — E1142 Type 2 diabetes mellitus with diabetic polyneuropathy: Secondary | ICD-10-CM | POA: Diagnosis not present

## 2023-03-20 DIAGNOSIS — H35031 Hypertensive retinopathy, right eye: Secondary | ICD-10-CM | POA: Diagnosis not present

## 2023-03-20 DIAGNOSIS — H04123 Dry eye syndrome of bilateral lacrimal glands: Secondary | ICD-10-CM | POA: Diagnosis not present

## 2023-03-20 DIAGNOSIS — H40013 Open angle with borderline findings, low risk, bilateral: Secondary | ICD-10-CM | POA: Diagnosis not present

## 2023-03-20 DIAGNOSIS — E119 Type 2 diabetes mellitus without complications: Secondary | ICD-10-CM | POA: Diagnosis not present

## 2023-03-27 DIAGNOSIS — I10 Essential (primary) hypertension: Secondary | ICD-10-CM | POA: Diagnosis not present

## 2023-03-27 DIAGNOSIS — E559 Vitamin D deficiency, unspecified: Secondary | ICD-10-CM | POA: Diagnosis not present

## 2023-03-27 DIAGNOSIS — Z0001 Encounter for general adult medical examination with abnormal findings: Secondary | ICD-10-CM | POA: Diagnosis not present

## 2023-03-27 DIAGNOSIS — K219 Gastro-esophageal reflux disease without esophagitis: Secondary | ICD-10-CM | POA: Diagnosis not present

## 2023-03-27 DIAGNOSIS — E1140 Type 2 diabetes mellitus with diabetic neuropathy, unspecified: Secondary | ICD-10-CM | POA: Diagnosis not present

## 2023-03-27 DIAGNOSIS — E785 Hyperlipidemia, unspecified: Secondary | ICD-10-CM | POA: Diagnosis not present

## 2023-03-27 DIAGNOSIS — Z6826 Body mass index (BMI) 26.0-26.9, adult: Secondary | ICD-10-CM | POA: Diagnosis not present

## 2023-03-28 DIAGNOSIS — E1140 Type 2 diabetes mellitus with diabetic neuropathy, unspecified: Secondary | ICD-10-CM | POA: Diagnosis not present

## 2023-03-28 DIAGNOSIS — I10 Essential (primary) hypertension: Secondary | ICD-10-CM | POA: Diagnosis not present

## 2023-04-08 DIAGNOSIS — L84 Corns and callosities: Secondary | ICD-10-CM | POA: Diagnosis not present

## 2023-04-08 DIAGNOSIS — E1142 Type 2 diabetes mellitus with diabetic polyneuropathy: Secondary | ICD-10-CM | POA: Diagnosis not present

## 2023-04-08 DIAGNOSIS — B351 Tinea unguium: Secondary | ICD-10-CM | POA: Diagnosis not present

## 2023-04-08 DIAGNOSIS — M79676 Pain in unspecified toe(s): Secondary | ICD-10-CM | POA: Diagnosis not present

## 2023-04-25 DIAGNOSIS — I10 Essential (primary) hypertension: Secondary | ICD-10-CM | POA: Diagnosis not present

## 2023-04-25 DIAGNOSIS — E1140 Type 2 diabetes mellitus with diabetic neuropathy, unspecified: Secondary | ICD-10-CM | POA: Diagnosis not present

## 2023-04-30 ENCOUNTER — Ambulatory Visit: Payer: Medicare PPO | Admitting: Orthopaedic Surgery

## 2023-04-30 ENCOUNTER — Encounter: Payer: Self-pay | Admitting: Orthopaedic Surgery

## 2023-04-30 ENCOUNTER — Other Ambulatory Visit (INDEPENDENT_AMBULATORY_CARE_PROVIDER_SITE_OTHER): Payer: Self-pay

## 2023-04-30 DIAGNOSIS — Z96642 Presence of left artificial hip joint: Secondary | ICD-10-CM | POA: Diagnosis not present

## 2023-04-30 NOTE — Progress Notes (Signed)
 The patient is now 4 months status post a left total hip arthroplasty.  We replaced her right hip several years ago.  She has some swelling and stiffness and numbness still with the left hip but it is getting better.  She is walking without assistive device.  She is very active at 78 years old.  On exam her right hip that we did several years ago was moving smoothly and fluidly.  The left hip has just a touch of stiffness but overall looks like it is moving well.  An AP pelvis and lateral left hip shows both total hip arthroplasties are well-seated with no complicating features.  She will continue to increase her activities as comfort allows.  Will see her back in 6 months with just a final standing AP pelvis.  She does have known arthritis in her right and left knee but so far those are stable.

## 2023-05-20 ENCOUNTER — Telehealth (INDEPENDENT_AMBULATORY_CARE_PROVIDER_SITE_OTHER): Payer: Self-pay | Admitting: Otolaryngology

## 2023-05-20 NOTE — Telephone Encounter (Signed)
 Left vm to confirm appt date and location for 05/21/2023. Husband appt at 1:20

## 2023-05-21 ENCOUNTER — Encounter (INDEPENDENT_AMBULATORY_CARE_PROVIDER_SITE_OTHER): Payer: Self-pay

## 2023-05-21 ENCOUNTER — Ambulatory Visit (INDEPENDENT_AMBULATORY_CARE_PROVIDER_SITE_OTHER): Payer: Medicare PPO

## 2023-05-21 VITALS — BP 124/78 | HR 62 | Ht 66.0 in | Wt 160.0 lb

## 2023-05-21 DIAGNOSIS — H903 Sensorineural hearing loss, bilateral: Secondary | ICD-10-CM | POA: Diagnosis not present

## 2023-05-21 DIAGNOSIS — H6123 Impacted cerumen, bilateral: Secondary | ICD-10-CM

## 2023-05-21 DIAGNOSIS — H608X1 Other otitis externa, right ear: Secondary | ICD-10-CM

## 2023-05-21 MED ORDER — MOMETASONE FUROATE 0.1 % EX CREA: TOPICAL_CREAM | CUTANEOUS | 3 refills | Status: AC

## 2023-05-22 DIAGNOSIS — H6123 Impacted cerumen, bilateral: Secondary | ICD-10-CM | POA: Insufficient documentation

## 2023-05-22 DIAGNOSIS — H903 Sensorineural hearing loss, bilateral: Secondary | ICD-10-CM | POA: Insufficient documentation

## 2023-05-22 DIAGNOSIS — H608X1 Other otitis externa, right ear: Secondary | ICD-10-CM | POA: Insufficient documentation

## 2023-05-22 NOTE — Progress Notes (Signed)
 Patient ID: Whitney Gill, female   DOB: 21-Nov-1945, 78 y.o.   MRN: [id number]  Follow-up: Hearing loss, itchy right ear  HPI: The patient is an 78 year old female who returns today for her follow-up evaluation.  The patient has a history of bilateral hearing loss.  She was fitted with bilateral hearing aids.  The patient returns today complaining of frequent itchy sensation in her right ear.  She also complains of increasing clogging sensation in her ears.  Currently she denies any change in her hearing, otalgia, otorrhea, or vertigo. No other ENT, GI, or respiratory issue noted since the last visit.   Exam: General: Communicates without difficulty, well nourished, no acute distress. Head: Normocephalic, no evidence injury, no tenderness, facial buttresses intact without stepoff. Eyes: PERRL, EOMI. No scleral icterus, conjunctivae clear. Neuro: CN II exam reveals vision grossly intact.  No nystagmus at any point of gaze. EAC: Bilateral cerumen impaction.  Under the operating microscope, the cerumen is carefully removed with a combination of cerumen currette, alligator forceps, and suction catheters.  After the cerumen is removed, the TMs are noted to be normal.  Eczematous changes are noted within the right ear canal.  Nose: External evaluation reveals normal support and skin without lesions.  Dorsum is intact.  Anterior rhinoscopy reveals healthy pink mucosa over anterior aspect of inferior turbinates and intact septum.  No purulence noted. Oral:  Oral cavity and oropharynx are intact, symmetric, without erythema or edema.  Mucosa is moist without lesions. Neck: Full range of motion without pain.  There is no significant lymphadenopathy.  No masses palpable.  Thyroid bed within normal limits to palpation.  Parotid glands and submandibular glands equal bilaterally without mass.  Trachea is midline. Neuro:  CN 2-12 grossly intact. Gait normal. Vestibular: No nystagmus at any point of gaze. The cerebellar  examination is unremarkable.   Procedure: Bilateral cerumen disimpaction Anesthesia: None Description: Under the operating microscope, the cerumen is carefully removed with a combination of cerumen currette, alligator forceps, and suction catheters.  After the cerumen is removed, the TMs are noted to be normal.  No mass, erythema, or lesions. The patient tolerated the procedure well.   Assessment: 1.  Bilateral cerumen impaction.  2.  Right chronic eczematous otitis externa.  The patient's ear canals, tympanic membranes and middle ear spaces are otherwise normal.  3.  Subjectively stable bilateral high-frequency sensorineural hearing loss.  Plan:  1. Otomicroscopy with cerumen disimpaction.  2. The patient is instructed not to use Q-tips to clean the ear canals.  3. Elocon cream to treat her chronic eczematous otitis externa. 4. Continue the use of her hearing aids. 5. The patient will follow up for re-evaluation in one year, sooner if needed.

## 2023-05-26 DIAGNOSIS — E1140 Type 2 diabetes mellitus with diabetic neuropathy, unspecified: Secondary | ICD-10-CM | POA: Diagnosis not present

## 2023-05-26 DIAGNOSIS — I10 Essential (primary) hypertension: Secondary | ICD-10-CM | POA: Diagnosis not present

## 2023-06-10 DIAGNOSIS — E1142 Type 2 diabetes mellitus with diabetic polyneuropathy: Secondary | ICD-10-CM | POA: Diagnosis not present

## 2023-06-10 DIAGNOSIS — M79674 Pain in right toe(s): Secondary | ICD-10-CM | POA: Diagnosis not present

## 2023-06-10 DIAGNOSIS — B351 Tinea unguium: Secondary | ICD-10-CM | POA: Diagnosis not present

## 2023-06-10 DIAGNOSIS — L84 Corns and callosities: Secondary | ICD-10-CM | POA: Diagnosis not present

## 2023-06-10 DIAGNOSIS — M79675 Pain in left toe(s): Secondary | ICD-10-CM | POA: Diagnosis not present

## 2023-06-15 IMAGING — MR MR LUMBAR SPINE W/O CM
4 of 5 series · 27 of 48 positions shown · non-contrast
Comparison: Lumbar spine radiographs 08/23/2020

CLINICAL DATA: Lumbosacral spinal stenosis.  Right leg pain

EXAM:
MRI LUMBAR SPINE WITHOUT CONTRAST
TECHNIQUE: Multiplanar, multisequence MR imaging of the lumbar spine was
performed. No intravenous contrast was administered.

[Series 3: T2 · sagittal · 4.0mm · 1.09mm/px · 6 of 17 slices shown (1 of 2)]
[im 1/17]
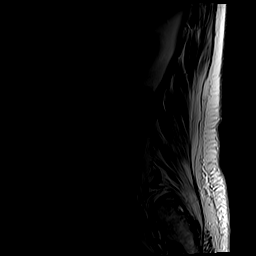
[im 4/17]
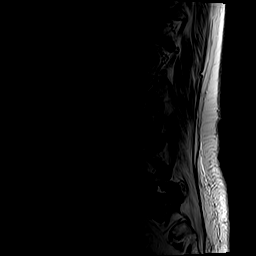
[im 7/17]
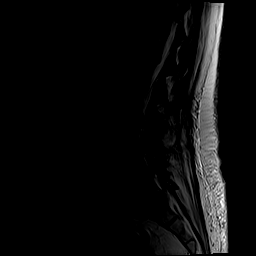
[im 10/17]
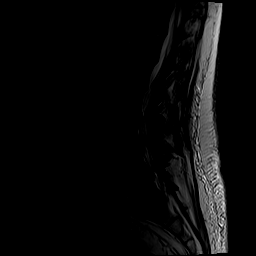
[im 13/17]
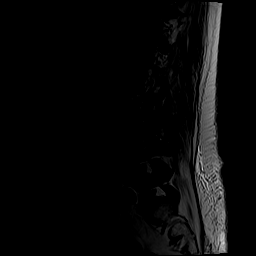
[im 17/17]
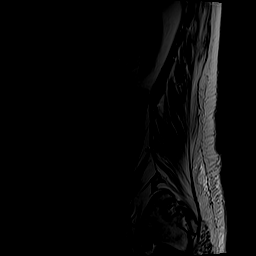

[Series 5: T1 · sagittal · 4.0mm · 1.09mm/px · 6 of 17 slices shown (1 of 2)]
[im 1/17]
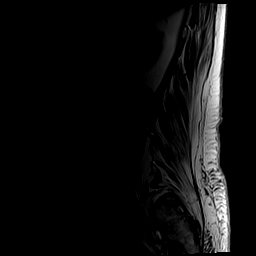
[im 4/17]
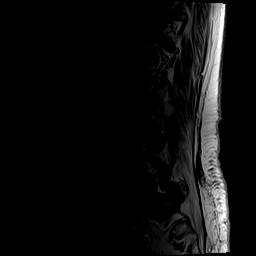
[im 7/17]
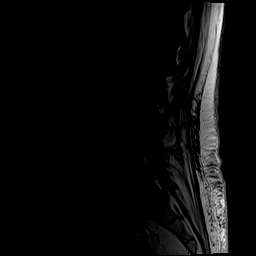
[im 10/17]
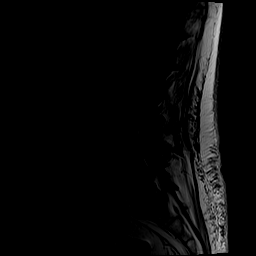
[im 13/17]
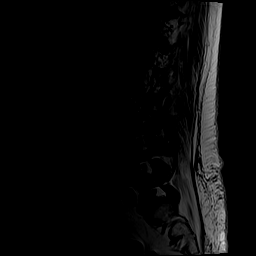
[im 17/17]
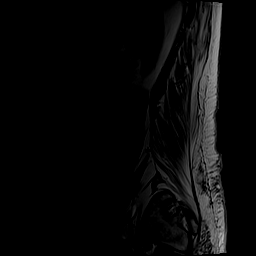

[Series 6: T2 · axial · 4.0mm · 0.39mm/px · z∈[-51,+166]mm · 9 of 42 slices shown (2 of 2)]
[im 1/42]
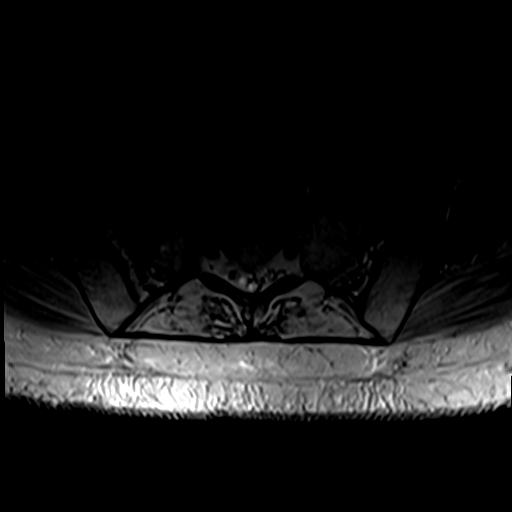
[im 6/42]
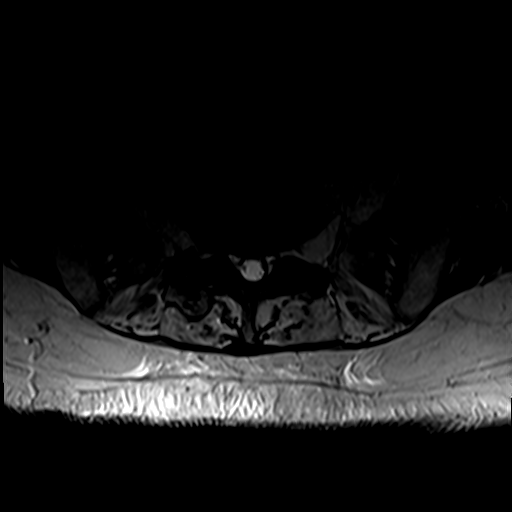
[im 12/42]
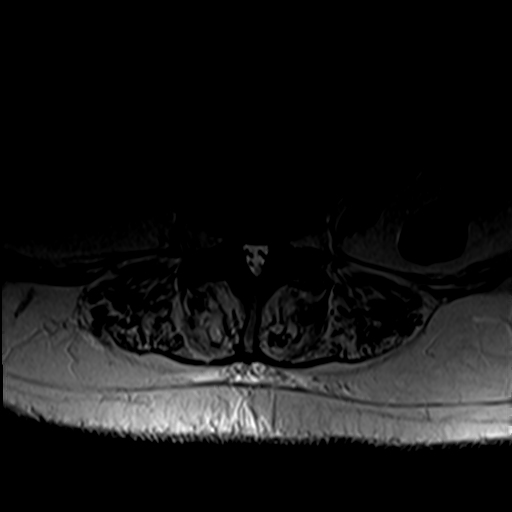
[im 18/42]
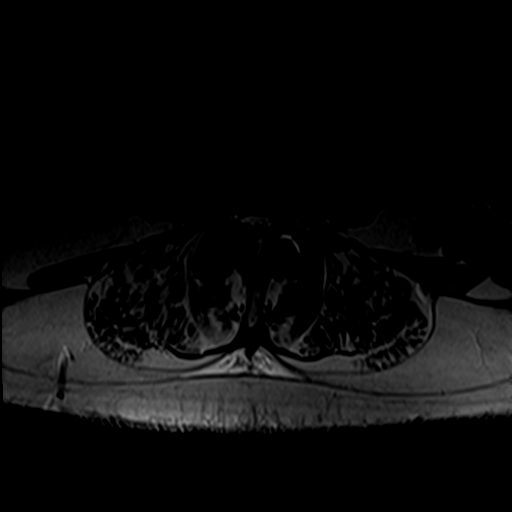
[im 21/42]
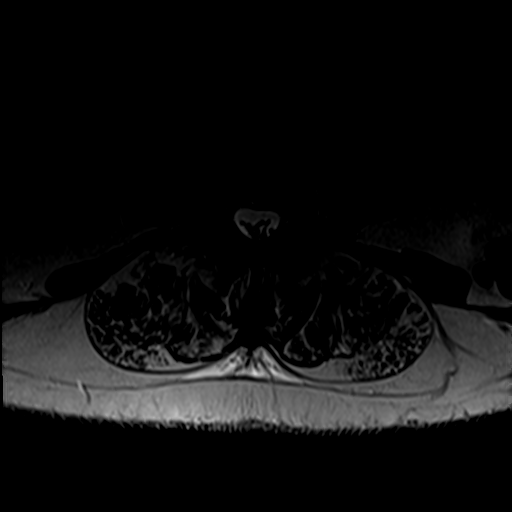
[im 24/42]
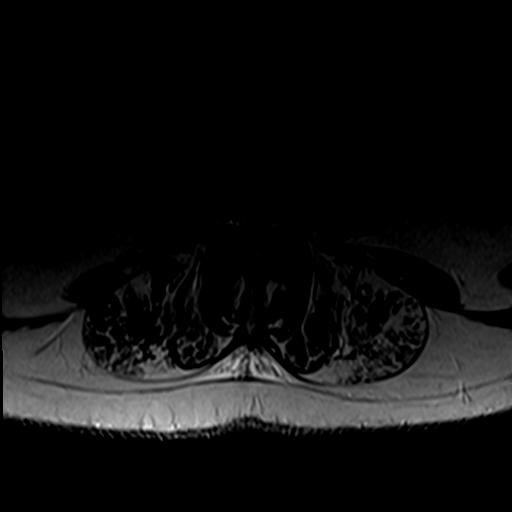
[im 30/42]
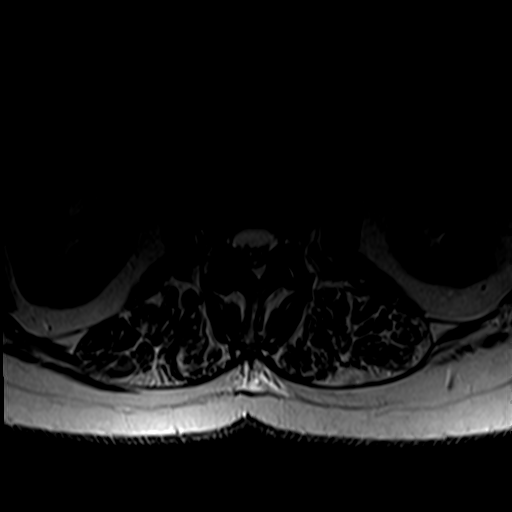
[im 36/42]
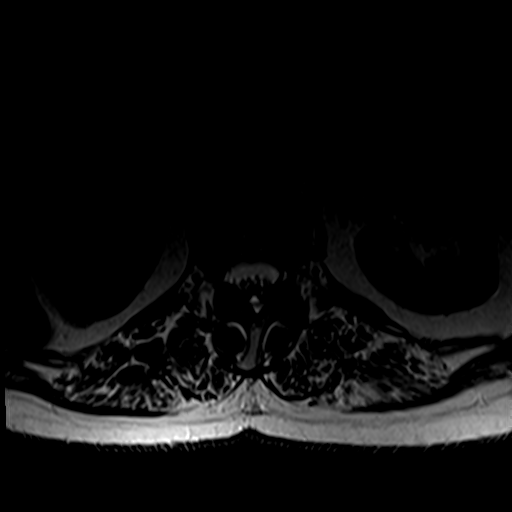
[im 42/42]
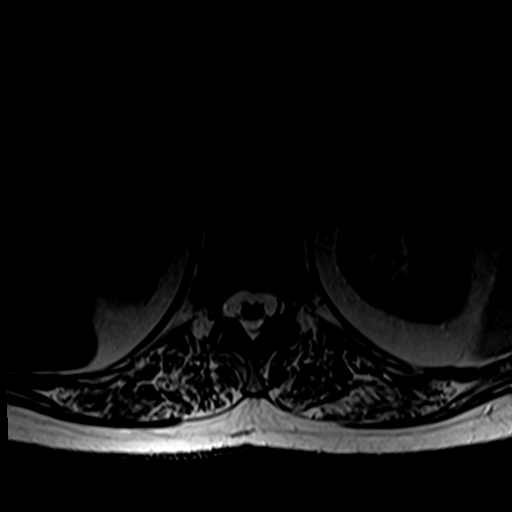

[Series 7: T1 · axial · 4.0mm · 0.39mm/px · z∈[-51,+138]mm · 6 of 42 slices shown (2 of 2)]
[im 1/42]
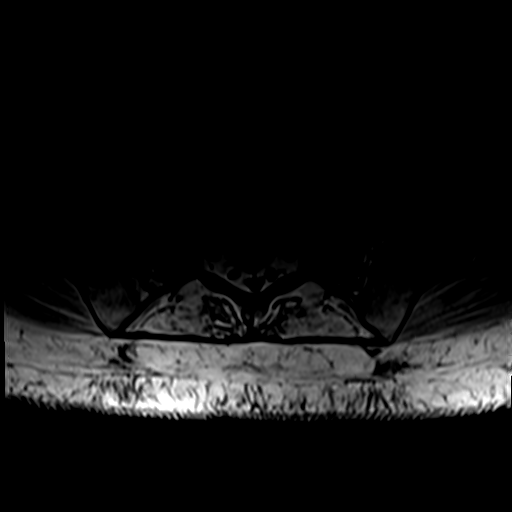
[im 6/42]
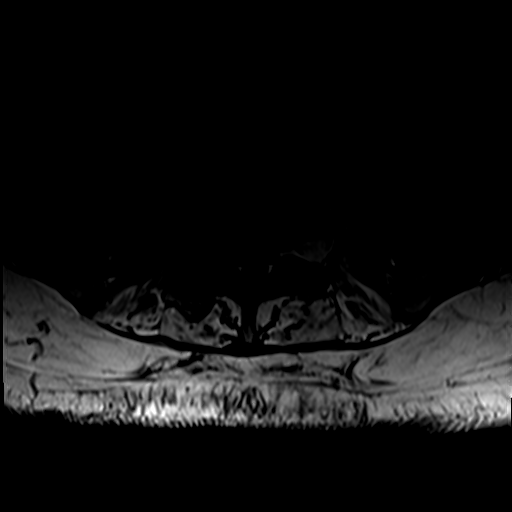
[im 12/42]
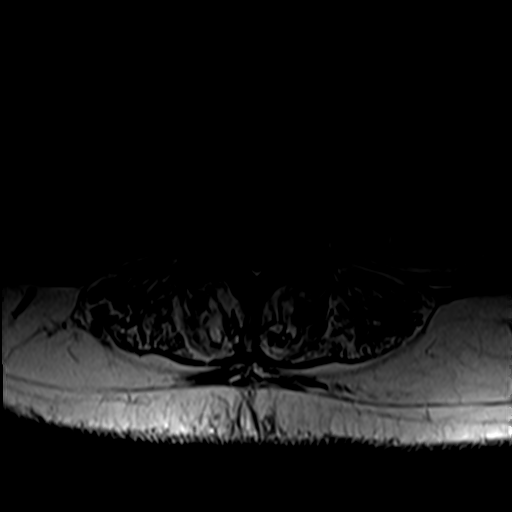
[im 18/42]
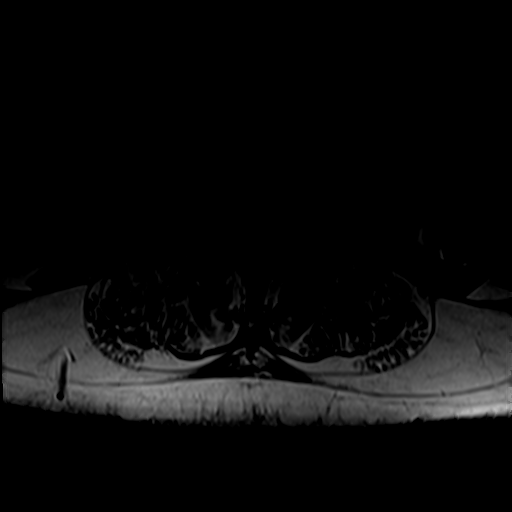
[im 21/42]
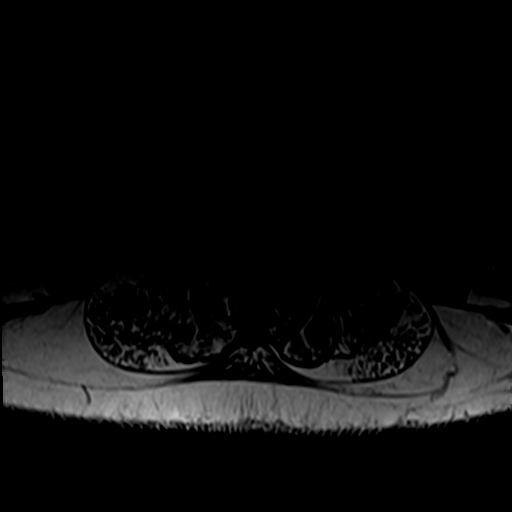
[im 36/42]
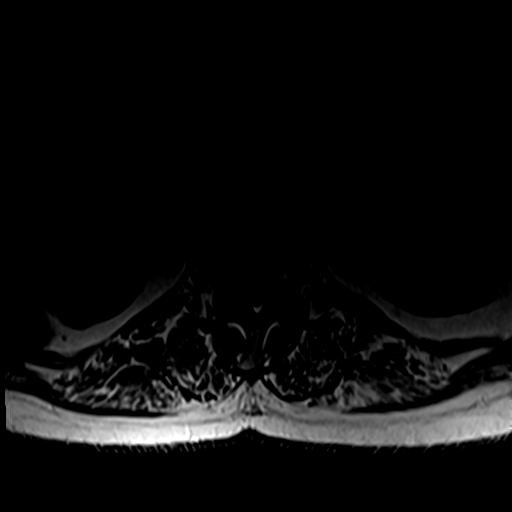

[27 of 48 positions shown; findings below may reference images not displayed]

FINDINGS: Segmentation:  Normal

Alignment: Slight retrolisthesis L1-2. Mild anterolisthesis L2-3 and
L4-5

Vertebrae:  Negative for fracture or mass.  Normal bone marrow.

Conus medullaris and cauda equina: Conus extends to the L1-2 level.
Conus and cauda equina appear normal.

Paraspinal and other soft tissues: Negative for paraspinous mass or
adenopathy. No soft tissue edema.

Disc levels:

T12-L1: Mild disc and facet degeneration. Mild disc bulging without
stenosis

L2-3: Mild disc and mild facet degeneration. Negative for disc
protrusion or stenosis

L2-3: Mild anterolisthesis. Diffuse disc bulging and moderate facet
hypertrophy. Mild spinal stenosis and mild subarticular stenosis
bilaterally.

L3-4: Disc degeneration with disc bulging. Moderate to advanced
facet hypertrophy. Mild spinal stenosis and mild subarticular
stenosis bilaterally.

L4-5: 3 mm anterolisthesis with moderate to severe facet
degeneration. Mild disc degeneration. Mild subarticular stenosis
bilaterally.

L5-S1: Mild disc degeneration. Moderate to severe facet hypertrophy
asymmetric on the right. This is contributing to right subarticular
stenosis with possible right S1 nerve root impingement. Negative for
disc protrusion.
IMPRESSION: Multilevel disc and facet degeneration in the lumbar spine causing
spinal and subarticular stenosis bilaterally. Possible right S1
nerve root impingement due to facet hypertrophy.

## 2023-06-25 DIAGNOSIS — E1140 Type 2 diabetes mellitus with diabetic neuropathy, unspecified: Secondary | ICD-10-CM | POA: Diagnosis not present

## 2023-06-25 DIAGNOSIS — I10 Essential (primary) hypertension: Secondary | ICD-10-CM | POA: Diagnosis not present

## 2023-07-08 DIAGNOSIS — E785 Hyperlipidemia, unspecified: Secondary | ICD-10-CM | POA: Diagnosis not present

## 2023-07-08 DIAGNOSIS — D519 Vitamin B12 deficiency anemia, unspecified: Secondary | ICD-10-CM | POA: Diagnosis not present

## 2023-07-08 DIAGNOSIS — E559 Vitamin D deficiency, unspecified: Secondary | ICD-10-CM | POA: Diagnosis not present

## 2023-07-08 DIAGNOSIS — E1169 Type 2 diabetes mellitus with other specified complication: Secondary | ICD-10-CM | POA: Diagnosis not present

## 2023-07-08 DIAGNOSIS — I10 Essential (primary) hypertension: Secondary | ICD-10-CM | POA: Diagnosis not present

## 2023-07-08 DIAGNOSIS — Z1329 Encounter for screening for other suspected endocrine disorder: Secondary | ICD-10-CM | POA: Diagnosis not present

## 2023-07-08 DIAGNOSIS — E7800 Pure hypercholesterolemia, unspecified: Secondary | ICD-10-CM | POA: Diagnosis not present

## 2023-07-11 DIAGNOSIS — Z2089 Contact with and (suspected) exposure to other communicable diseases: Secondary | ICD-10-CM | POA: Diagnosis not present

## 2023-07-11 DIAGNOSIS — Z20828 Contact with and (suspected) exposure to other viral communicable diseases: Secondary | ICD-10-CM | POA: Diagnosis not present

## 2023-07-11 DIAGNOSIS — J019 Acute sinusitis, unspecified: Secondary | ICD-10-CM | POA: Diagnosis not present

## 2023-07-11 DIAGNOSIS — Z6826 Body mass index (BMI) 26.0-26.9, adult: Secondary | ICD-10-CM | POA: Diagnosis not present

## 2023-07-15 DIAGNOSIS — Z1231 Encounter for screening mammogram for malignant neoplasm of breast: Secondary | ICD-10-CM | POA: Diagnosis not present

## 2023-07-25 DIAGNOSIS — I10 Essential (primary) hypertension: Secondary | ICD-10-CM | POA: Diagnosis not present

## 2023-07-25 DIAGNOSIS — E1140 Type 2 diabetes mellitus with diabetic neuropathy, unspecified: Secondary | ICD-10-CM | POA: Diagnosis not present

## 2023-08-08 DIAGNOSIS — K219 Gastro-esophageal reflux disease without esophagitis: Secondary | ICD-10-CM | POA: Diagnosis not present

## 2023-08-08 DIAGNOSIS — I10 Essential (primary) hypertension: Secondary | ICD-10-CM | POA: Diagnosis not present

## 2023-08-08 DIAGNOSIS — E785 Hyperlipidemia, unspecified: Secondary | ICD-10-CM | POA: Diagnosis not present

## 2023-08-08 DIAGNOSIS — Z0001 Encounter for general adult medical examination with abnormal findings: Secondary | ICD-10-CM | POA: Diagnosis not present

## 2023-08-08 DIAGNOSIS — Z6827 Body mass index (BMI) 27.0-27.9, adult: Secondary | ICD-10-CM | POA: Diagnosis not present

## 2023-08-08 DIAGNOSIS — E1169 Type 2 diabetes mellitus with other specified complication: Secondary | ICD-10-CM | POA: Diagnosis not present

## 2023-08-19 DIAGNOSIS — B351 Tinea unguium: Secondary | ICD-10-CM | POA: Diagnosis not present

## 2023-08-19 DIAGNOSIS — E1142 Type 2 diabetes mellitus with diabetic polyneuropathy: Secondary | ICD-10-CM | POA: Diagnosis not present

## 2023-08-19 DIAGNOSIS — M79674 Pain in right toe(s): Secondary | ICD-10-CM | POA: Diagnosis not present

## 2023-08-19 DIAGNOSIS — M79675 Pain in left toe(s): Secondary | ICD-10-CM | POA: Diagnosis not present

## 2023-08-19 DIAGNOSIS — L84 Corns and callosities: Secondary | ICD-10-CM | POA: Diagnosis not present

## 2023-08-25 DIAGNOSIS — E1140 Type 2 diabetes mellitus with diabetic neuropathy, unspecified: Secondary | ICD-10-CM | POA: Diagnosis not present

## 2023-08-25 DIAGNOSIS — I10 Essential (primary) hypertension: Secondary | ICD-10-CM | POA: Diagnosis not present

## 2023-09-25 DIAGNOSIS — E1140 Type 2 diabetes mellitus with diabetic neuropathy, unspecified: Secondary | ICD-10-CM | POA: Diagnosis not present

## 2023-09-25 DIAGNOSIS — I10 Essential (primary) hypertension: Secondary | ICD-10-CM | POA: Diagnosis not present

## 2023-10-21 DIAGNOSIS — L84 Corns and callosities: Secondary | ICD-10-CM | POA: Diagnosis not present

## 2023-10-21 DIAGNOSIS — M79674 Pain in right toe(s): Secondary | ICD-10-CM | POA: Diagnosis not present

## 2023-10-21 DIAGNOSIS — E1142 Type 2 diabetes mellitus with diabetic polyneuropathy: Secondary | ICD-10-CM | POA: Diagnosis not present

## 2023-10-21 DIAGNOSIS — B351 Tinea unguium: Secondary | ICD-10-CM | POA: Diagnosis not present

## 2023-10-21 DIAGNOSIS — M79675 Pain in left toe(s): Secondary | ICD-10-CM | POA: Diagnosis not present

## 2023-10-24 DIAGNOSIS — I10 Essential (primary) hypertension: Secondary | ICD-10-CM | POA: Diagnosis not present

## 2023-10-24 DIAGNOSIS — E1140 Type 2 diabetes mellitus with diabetic neuropathy, unspecified: Secondary | ICD-10-CM | POA: Diagnosis not present

## 2023-10-29 ENCOUNTER — Other Ambulatory Visit (INDEPENDENT_AMBULATORY_CARE_PROVIDER_SITE_OTHER): Payer: Self-pay

## 2023-10-29 ENCOUNTER — Ambulatory Visit: Admitting: Orthopaedic Surgery

## 2023-10-29 DIAGNOSIS — M1711 Unilateral primary osteoarthritis, right knee: Secondary | ICD-10-CM

## 2023-10-29 DIAGNOSIS — Z96642 Presence of left artificial hip joint: Secondary | ICD-10-CM

## 2023-10-29 MED ORDER — LIDOCAINE HCL 1 % IJ SOLN
3.0000 mL | INTRAMUSCULAR | Status: AC | PRN
  Administered 2023-10-29: 3 mL

## 2023-10-29 MED ORDER — METHYLPREDNISOLONE ACETATE 40 MG/ML IJ SUSP
40.0000 mg | INTRAMUSCULAR | Status: AC | PRN
  Administered 2023-10-29: 40 mg via INTRA_ARTICULAR

## 2023-10-29 NOTE — Progress Notes (Signed)
 The patient is a 78 year old female who is now had both of her hips replaced.  Her most recent left one was back in October 2025.  Both hips are doing very well.  She walks without any assistive device and is very active.  However her right knee has been hurting for about the last week now when she slipped and fell has been painful since then.  She has remote history of arthroscopic surgery on that knee for meniscal tear years ago.  Examination of both hips show they move smoothly and fluidly.  Examination of her right knee she has pain throughout the arc of motion of the knee but her extensor mechanism is intact the knee is lunacy stable.  There is global pain and tenderness with only mild effusion.  X-rays of the pelvis and hips show normal-appearing hip replacements bilaterally with no complicating features.  X-rays of the right knee shows tricompartment arthritis but no effusion and no acute findings but near bone-on-bone wear of all 3 compartments.  I did recommend a steroid injection in her right knee today since her pain has been more acute.  She agreed to this and tolerated it very well.  At this point follow-up is as needed but if she continues to have issues with the right knee the only other option would be a knee replacement.  We can always reinject her knee in about 3 months.    Procedure Note  Patient: Whitney Gill             Date of Birth: 07-26-1945           MRN: [id number]             Visit Date: 10/29/2023  Procedures: Visit Diagnoses:  1. History of left hip replacement   2. Unilateral primary osteoarthritis, right knee     Large Joint Inj: R knee on 10/29/2023 1:18 PM Indications: diagnostic evaluation and pain Details: 22 G 1.5 in needle, superolateral approach  Arthrogram: No  Medications: 3 mL lidocaine  1 %; 40 mg methylPREDNISolone  acetate 40 MG/ML Outcome: tolerated well, no immediate complications Procedure, treatment alternatives, risks and benefits  explained, specific risks discussed. Consent was given by the patient. Immediately prior to procedure a time out was called to verify the correct patient, procedure, equipment, support staff and site/side marked as required. Patient was prepped and draped in the usual sterile fashion.

## 2023-11-25 DIAGNOSIS — E1140 Type 2 diabetes mellitus with diabetic neuropathy, unspecified: Secondary | ICD-10-CM | POA: Diagnosis not present

## 2023-11-25 DIAGNOSIS — I10 Essential (primary) hypertension: Secondary | ICD-10-CM | POA: Diagnosis not present

## 2023-12-10 ENCOUNTER — Encounter (INDEPENDENT_AMBULATORY_CARE_PROVIDER_SITE_OTHER): Payer: Self-pay | Admitting: Gastroenterology

## 2023-12-23 DIAGNOSIS — B351 Tinea unguium: Secondary | ICD-10-CM | POA: Diagnosis not present

## 2023-12-23 DIAGNOSIS — M79674 Pain in right toe(s): Secondary | ICD-10-CM | POA: Diagnosis not present

## 2023-12-23 DIAGNOSIS — M79675 Pain in left toe(s): Secondary | ICD-10-CM | POA: Diagnosis not present

## 2023-12-23 DIAGNOSIS — L84 Corns and callosities: Secondary | ICD-10-CM | POA: Diagnosis not present

## 2023-12-23 DIAGNOSIS — E1142 Type 2 diabetes mellitus with diabetic polyneuropathy: Secondary | ICD-10-CM | POA: Diagnosis not present

## 2023-12-26 DIAGNOSIS — I10 Essential (primary) hypertension: Secondary | ICD-10-CM | POA: Diagnosis not present

## 2023-12-26 DIAGNOSIS — E1140 Type 2 diabetes mellitus with diabetic neuropathy, unspecified: Secondary | ICD-10-CM | POA: Diagnosis not present

## 2023-12-29 ENCOUNTER — Encounter: Payer: Self-pay | Admitting: Radiology

## 2024-01-15 DIAGNOSIS — M810 Age-related osteoporosis without current pathological fracture: Secondary | ICD-10-CM | POA: Diagnosis not present

## 2024-01-15 DIAGNOSIS — Z780 Asymptomatic menopausal state: Secondary | ICD-10-CM | POA: Diagnosis not present

## 2024-01-23 DIAGNOSIS — I10 Essential (primary) hypertension: Secondary | ICD-10-CM | POA: Diagnosis not present

## 2024-01-23 DIAGNOSIS — E1140 Type 2 diabetes mellitus with diabetic neuropathy, unspecified: Secondary | ICD-10-CM | POA: Diagnosis not present

## 2024-01-29 ENCOUNTER — Ambulatory Visit (INDEPENDENT_AMBULATORY_CARE_PROVIDER_SITE_OTHER): Payer: Self-pay | Admitting: Audiology

## 2024-01-29 DIAGNOSIS — H903 Sensorineural hearing loss, bilateral: Secondary | ICD-10-CM

## 2024-01-29 NOTE — Progress Notes (Signed)
  [address] [place], [place] [address] [phone]  Hearing Aid Check     Whitney Gill comes for a scheduled appointment for a hearing aid check.   Accompanied ab:[id number]   Right Left  Hearing aid manufacturer [employer] Intent 3 [id number] [employer] Intent 3 [id number]  Hearing aid style Receiver in the canal Receiver in the canal  Hearing aid battery Rechargeable  Rechargeable   Receiver    Dome/ custom earpiece Open bass 6mm Open bass 6mm  Retention wire yes yes  Warranty expiration date 08/17/2025 08/17/2025  Loss and Damage unknown unknown  Additional accessories Expiration date [employer]ger 08/17/2025   Initial fitting date 07/24/18 07/24/18  Device was fit at: [facility]    Chief complaint: Patient reports needs hearing aids ot be cleaned and would like more domes/filters. Also the patient had question about the streaming functions.  Actions taken: Cleaned both aids. Patient pleased  Services fee: $0 was paid at checkout.  Patient was oriented about streaming function, [employer]'s patient help line and when her warranty ends..  Recommend: Return for a hearing aid check ,as needed. Return for a hearing evaluation and to see an ENT, if concerns with hearing changes arise.    Whitney Gill, AUD

## 2024-05-24 ENCOUNTER — Ambulatory Visit (INDEPENDENT_AMBULATORY_CARE_PROVIDER_SITE_OTHER): Admitting: Otolaryngology
